# Patient Record
Sex: Female | Born: 1955 | Race: Black or African American | Hispanic: No | Smoking: Former smoker
Health system: Southern US, Community
[De-identification: ages and names within clinical notes are randomized; demographics above are authoritative.]

## PROBLEM LIST (undated history)

## (undated) ENCOUNTER — Emergency Department (HOSPITAL_COMMUNITY): Admission: EM | Payer: Medicare HMO | Source: Home / Self Care

## (undated) DIAGNOSIS — F329 Major depressive disorder, single episode, unspecified: Secondary | ICD-10-CM

## (undated) DIAGNOSIS — F32A Depression, unspecified: Secondary | ICD-10-CM

## (undated) DIAGNOSIS — J302 Other seasonal allergic rhinitis: Secondary | ICD-10-CM

## (undated) DIAGNOSIS — Z720 Tobacco use: Secondary | ICD-10-CM

## (undated) DIAGNOSIS — E785 Hyperlipidemia, unspecified: Secondary | ICD-10-CM

## (undated) DIAGNOSIS — I10 Essential (primary) hypertension: Secondary | ICD-10-CM

## (undated) HISTORY — DX: Major depressive disorder, single episode, unspecified: F32.9

## (undated) HISTORY — DX: Tobacco use: Z72.0

## (undated) HISTORY — DX: Depression, unspecified: F32.A

## (undated) HISTORY — DX: Hyperlipidemia, unspecified: E78.5

---

## 1999-01-18 ENCOUNTER — Emergency Department (HOSPITAL_COMMUNITY): Admission: EM | Admit: 1999-01-18 | Discharge: 1999-01-18 | Payer: Self-pay | Admitting: Emergency Medicine

## 2003-07-31 ENCOUNTER — Emergency Department (HOSPITAL_COMMUNITY): Admission: EM | Admit: 2003-07-31 | Discharge: 2003-07-31 | Payer: Self-pay | Admitting: *Deleted

## 2003-07-31 ENCOUNTER — Encounter: Payer: Self-pay | Admitting: Family Medicine

## 2004-06-27 ENCOUNTER — Emergency Department (HOSPITAL_COMMUNITY): Admission: EM | Admit: 2004-06-27 | Discharge: 2004-06-27 | Payer: Self-pay | Admitting: Emergency Medicine

## 2006-01-01 ENCOUNTER — Ambulatory Visit: Payer: Self-pay | Admitting: *Deleted

## 2006-01-01 ENCOUNTER — Ambulatory Visit: Payer: Self-pay | Admitting: Nurse Practitioner

## 2006-01-17 ENCOUNTER — Ambulatory Visit: Payer: Self-pay | Admitting: Nurse Practitioner

## 2006-01-23 ENCOUNTER — Ambulatory Visit: Payer: Self-pay | Admitting: Nurse Practitioner

## 2006-01-31 ENCOUNTER — Ambulatory Visit (HOSPITAL_COMMUNITY): Admission: RE | Admit: 2006-01-31 | Discharge: 2006-01-31 | Payer: Self-pay | Admitting: Family Medicine

## 2006-02-14 ENCOUNTER — Ambulatory Visit: Payer: Self-pay | Admitting: Nurse Practitioner

## 2006-04-04 ENCOUNTER — Ambulatory Visit: Payer: Self-pay | Admitting: Nurse Practitioner

## 2006-04-04 ENCOUNTER — Ambulatory Visit: Payer: Self-pay | Admitting: *Deleted

## 2010-10-05 ENCOUNTER — Emergency Department (HOSPITAL_COMMUNITY)
Admission: EM | Admit: 2010-10-05 | Discharge: 2010-10-05 | Payer: Self-pay | Source: Home / Self Care | Admitting: Emergency Medicine

## 2010-10-11 ENCOUNTER — Emergency Department (HOSPITAL_COMMUNITY)
Admission: EM | Admit: 2010-10-11 | Discharge: 2010-10-11 | Payer: Self-pay | Source: Home / Self Care | Admitting: Emergency Medicine

## 2010-11-10 ENCOUNTER — Emergency Department (HOSPITAL_COMMUNITY)
Admission: EM | Admit: 2010-11-10 | Discharge: 2010-11-10 | Payer: Self-pay | Source: Home / Self Care | Admitting: Emergency Medicine

## 2010-11-13 LAB — COMPREHENSIVE METABOLIC PANEL
ALT: 26 U/L (ref 0–35)
AST: 30 U/L (ref 0–37)
Albumin: 3.7 g/dL (ref 3.5–5.2)
Alkaline Phosphatase: 88 U/L (ref 39–117)
CO2: 24 mEq/L (ref 19–32)
Chloride: 104 mEq/L (ref 96–112)
Creatinine, Ser: 0.83 mg/dL (ref 0.4–1.2)
GFR calc Af Amer: >60 mL/min (ref >60)
GFR calc non Af Amer: >60 mL/min (ref >60)
Potassium: 4 mEq/L (ref 3.5–5.1)
Total Bilirubin: 0.5 mg/dL (ref 0.3–1.2)

## 2010-11-13 LAB — URINALYSIS, ROUTINE W REFLEX MICROSCOPIC
Bilirubin Urine: NEGATIVE
Ketones, ur: NEGATIVE mg/dL
Protein, ur: NEGATIVE mg/dL
Urine Glucose, Fasting: NEGATIVE mg/dL

## 2010-11-13 LAB — RAPID URINE DRUG SCREEN, HOSP PERFORMED
Amphetamines: NOT DETECTED
Opiates: NOT DETECTED
Tetrahydrocannabinol: NOT DETECTED

## 2010-11-13 LAB — DIFFERENTIAL
Basophils Absolute: 0 10*3/uL (ref 0.0–0.1)
Basophils Relative: 0 % (ref 0–1)
Eosinophils Absolute: 0 10*3/uL (ref 0.0–0.7)
Eosinophils Relative: 0 % (ref 0–5)
Lymphocytes Relative: 17 % (ref 12–46)
Lymphs Abs: 1.3 10*3/uL (ref 0.7–4.0)
Monocytes Absolute: 0.4 10*3/uL (ref 0.1–1.0)
Monocytes Relative: 6 % (ref 3–12)
Neutro Abs: 5.5 10*3/uL (ref 1.7–7.7)
Neutrophils Relative %: 76 % (ref 43–77)

## 2010-11-13 LAB — POCT CARDIAC MARKERS
CKMB, poc: <1 ng/mL — ABNORMAL LOW (ref 1.0–8.0)
Myoglobin, poc: 57.8 ng/mL (ref 12–200)
Myoglobin, poc: 46.7 ng/mL (ref 12–200)
Troponin i, poc: <0.05 ng/mL (ref 0.00–0.09)
Troponin i, poc: <0.05 ng/mL (ref 0.00–0.09)

## 2010-11-13 LAB — POCT PREGNANCY, URINE: Preg Test, Ur: NEGATIVE

## 2010-11-13 LAB — CBC
HCT: 40.9 % (ref 36.0–46.0)
Hemoglobin: 13.8 g/dL (ref 12.0–15.0)
MCH: 30.6 pg (ref 26.0–34.0)
MCHC: 33.7 g/dL (ref 30.0–36.0)
MCV: 90.7 fL (ref 78.0–100.0)
Platelets: 278 10*3/uL (ref 150–400)
RBC: 4.51 MIL/uL (ref 3.87–5.11)
RDW: 14.4 % (ref 11.5–15.5)
WBC: 7.2 10*3/uL (ref 4.0–10.5)

## 2012-07-20 ENCOUNTER — Emergency Department (HOSPITAL_COMMUNITY): Payer: Self-pay

## 2012-07-20 ENCOUNTER — Encounter (HOSPITAL_COMMUNITY): Payer: Self-pay | Admitting: *Deleted

## 2012-07-20 ENCOUNTER — Emergency Department (HOSPITAL_COMMUNITY)
Admission: EM | Admit: 2012-07-20 | Discharge: 2012-07-20 | Disposition: A | Discharge status: Home / Self Care | Payer: Self-pay | Attending: Emergency Medicine | Admitting: Emergency Medicine

## 2012-07-20 DIAGNOSIS — S8390XA Sprain of unspecified site of unspecified knee, initial encounter: Secondary | ICD-10-CM

## 2012-07-20 DIAGNOSIS — Y998 Other external cause status: Secondary | ICD-10-CM | POA: Insufficient documentation

## 2012-07-20 DIAGNOSIS — Y9301 Activity, walking, marching and hiking: Secondary | ICD-10-CM | POA: Insufficient documentation

## 2012-07-20 DIAGNOSIS — S8990XA Unspecified injury of unspecified lower leg, initial encounter: Secondary | ICD-10-CM | POA: Insufficient documentation

## 2012-07-20 DIAGNOSIS — S8000XA Contusion of unspecified knee, initial encounter: Secondary | ICD-10-CM

## 2012-07-20 DIAGNOSIS — F17200 Nicotine dependence, unspecified, uncomplicated: Secondary | ICD-10-CM | POA: Insufficient documentation

## 2012-07-20 DIAGNOSIS — S82009A Unspecified fracture of unspecified patella, initial encounter for closed fracture: Secondary | ICD-10-CM

## 2012-07-20 DIAGNOSIS — W010XXA Fall on same level from slipping, tripping and stumbling without subsequent striking against object, initial encounter: Secondary | ICD-10-CM | POA: Insufficient documentation

## 2012-07-20 MED ORDER — HYDROCODONE-ACETAMINOPHEN 5-500 MG PO TABS: 1.0000 | ORAL_TABLET | Freq: Four times a day (QID) | ORAL | Status: DC | PRN

## 2012-07-20 MED ORDER — HYDROCODONE-ACETAMINOPHEN 5-325 MG PO TABS
2.0000 | ORAL_TABLET | Freq: Once | ORAL | Status: AC
  Administered 2012-07-20: 2 via ORAL
  Filled 2012-07-20: qty 2

## 2012-07-20 NOTE — ED Provider Notes (Signed)
History     CSN: [id number]  Arrival date & time 07/20/12  1308   First MD Initiated Contact with Patient 07/20/12 [phone]      Chief Complaint  Patient presents with  . Knee Pain    (Consider location/radiation/quality/duration/timing/severity/associated sxs/prior treatment) Patient is a 56 y.o. female presenting with knee pain. The history is provided by the patient and the EMS personnel.  Knee Pain Pertinent negatives include no chest pain, no abdominal pain, no headaches and no shortness of breath.  pt states walking home from work last night tripped, falling onto left knee. C/o constant, dull, mod-sev, nonradiating pain to left knee. No prior injury. Is ambulatory. No ankle or hip pain. No numbness/weakness. Small, superficial abrasion to left elbow. No elbow pain or pain w rom. Tetanus within past 2 yrs per patient. Denies head injury or loc. No headache. No neck or back pain. Denies other injury. Denies any faintness or dizziness.      History reviewed. No pertinent past medical history.  History reviewed. No pertinent past surgical history.  No family history on file.  History  Substance Use Topics  . Smoking status: Current Every Day Smoker -- 0.5 packs/day for 5 years    Types: Cigarettes  . Smokeless tobacco: Not on file  . Alcohol Use: Yes    OB History    Grav Para Term Preterm Abortions TAB SAB Ect Mult Living                  Review of Systems  Constitutional: Negative for fever.  HENT: Negative for neck pain and neck stiffness.   Respiratory: Negative for shortness of breath.   Cardiovascular: Negative for chest pain.  Gastrointestinal: Negative for nausea, vomiting and abdominal pain.  Musculoskeletal: Negative for back pain.  Neurological: Negative for headaches.    Allergies  Review of patient's allergies indicates no known allergies.  Home Medications  No current outpatient prescriptions on file.  BP 124/80  Pulse 96  Temp 99.3 F (37.4  C) (Oral)  Resp 16  SpO2 97%  Physical Exam  Nursing note and vitals reviewed. Constitutional: She is oriented to person, place, and time. She appears well-developed and well-nourished. No distress.  HENT:  Head: Atraumatic.  Eyes: Conjunctivae normal are normal. Pupils are equal, round, and reactive to light. No scleral icterus.  Neck: Normal range of motion. Neck supple. No tracheal deviation present.  Cardiovascular: Normal rate and intact distal pulses.   Pulmonary/Chest: Effort normal and breath sounds normal. No respiratory distress. She exhibits no tenderness.  Abdominal: Soft. Normal appearance and bowel sounds are normal. She exhibits no distension. There is no tenderness.  Musculoskeletal:       Mild sts and tenderness left knee anteriorly. Knee stable, no gross ligament laxity appreciated. Limited rom due to pain. Good rom at hip and ankle without pain. No other focal bony tenderness noted on bil ext exam. Distal pulses palp bil ext. Pt w superficial abrasion left elbow, no bony tenderness. CTLS spine, non tender, aligned, no step off.   Neurological: She is alert and oriented to person, place, and time.       Motor intact bil.   Skin: Skin is warm and dry. No rash noted.  Psychiatric: She has a normal mood and affect.    ED Course  Procedures (including critical care time)  Dg Knee Complete 4 Views Left  07/20/2012  *RADIOLOGY REPORT*  Clinical Data:  Knee pain, swelling  LEFT KNEE - COMPLETE  4+ VIEW  Comparison: None.  Findings: Subtle lucency through the superior pole the patella seen best on the lateral view concerning for nondisplaced fracture. There is a moderate knee joint effusion, presumably hemarthrosis. No additional acute fracture, or malalignment identified.  IMPRESSION:  1.  Irregular lucency through the superior pole of the patella concerning for a nondisplaced fracture. A bipartite patella could have a similar appearance.  Recommend clinical correlation for  point tenderness at the superior patella. CT scan could further evaluate if clinically warranted.  2.  Moderate suprapatellar knee joint effusion, presumed hemarthrosis in the setting of trauma.   Original Report Authenticated By: Vilma Prader       MDM  Xrays. Icepack. vicodin po (no meds pta, confirmed nkda w pt).  Reviewed nursing notes and prior charts for additional history.    Knee immobilizer and crutches. Ice pack.  Discussed w ortho on call, Dr Luiz Blare - he reviewed films, states knee imm,crutches and f/u office later this week.   Discussed plan w pt, pt agreeable. Pain controlled.        Suzi Roots, MD 07/20/12 1018

## 2012-07-20 NOTE — ED Notes (Signed)
JXB:[id number]<GN> Expected date:07/20/12<BR> Expected time: 7:39 AM<BR> Means of arrival:Ambulance<BR> Comments:<BR> Knee swelling

## 2012-07-20 NOTE — ED Notes (Signed)
per EMS: pt reports unwittnessed fall yesterday while walking home from work. Pt "tripped and fell". Reports left knee swelling since this morning, no obvious deformities, tender on palpation. Abrasion to left elbow present. Pt not able to bare weight on left side. Strong pedal pulses bilat. bp 140/88, pulse 88, respirations 18 even/non-labored

## 2013-11-30 ENCOUNTER — Encounter (HOSPITAL_COMMUNITY): Payer: Self-pay | Admitting: Emergency Medicine

## 2013-11-30 ENCOUNTER — Emergency Department (HOSPITAL_COMMUNITY): Payer: Worker's Compensation

## 2013-11-30 ENCOUNTER — Emergency Department (HOSPITAL_COMMUNITY)
Admission: EM | Admit: 2013-11-30 | Discharge: 2013-11-30 | Disposition: A | Discharge status: Home / Self Care | Payer: Worker's Compensation | Attending: Emergency Medicine | Admitting: Emergency Medicine

## 2013-11-30 DIAGNOSIS — Y990 Civilian activity done for income or pay: Secondary | ICD-10-CM | POA: Insufficient documentation

## 2013-11-30 DIAGNOSIS — S46909A Unspecified injury of unspecified muscle, fascia and tendon at shoulder and upper arm level, unspecified arm, initial encounter: Secondary | ICD-10-CM | POA: Insufficient documentation

## 2013-11-30 DIAGNOSIS — Y9389 Activity, other specified: Secondary | ICD-10-CM | POA: Insufficient documentation

## 2013-11-30 DIAGNOSIS — S4980XA Other specified injuries of shoulder and upper arm, unspecified arm, initial encounter: Secondary | ICD-10-CM | POA: Insufficient documentation

## 2013-11-30 DIAGNOSIS — M51379 Other intervertebral disc degeneration, lumbosacral region without mention of lumbar back pain or lower extremity pain: Secondary | ICD-10-CM | POA: Insufficient documentation

## 2013-11-30 DIAGNOSIS — M542 Cervicalgia: Secondary | ICD-10-CM

## 2013-11-30 DIAGNOSIS — F17200 Nicotine dependence, unspecified, uncomplicated: Secondary | ICD-10-CM | POA: Insufficient documentation

## 2013-11-30 DIAGNOSIS — M5136 Other intervertebral disc degeneration, lumbar region: Secondary | ICD-10-CM

## 2013-11-30 DIAGNOSIS — S0993XA Unspecified injury of face, initial encounter: Secondary | ICD-10-CM | POA: Insufficient documentation

## 2013-11-30 DIAGNOSIS — M5020 Other cervical disc displacement, unspecified cervical region: Secondary | ICD-10-CM | POA: Insufficient documentation

## 2013-11-30 DIAGNOSIS — S199XXA Unspecified injury of neck, initial encounter: Principal | ICD-10-CM

## 2013-11-30 DIAGNOSIS — Z79899 Other long term (current) drug therapy: Secondary | ICD-10-CM | POA: Insufficient documentation

## 2013-11-30 DIAGNOSIS — W19XXXA Unspecified fall, initial encounter: Secondary | ICD-10-CM

## 2013-11-30 DIAGNOSIS — M5137 Other intervertebral disc degeneration, lumbosacral region: Secondary | ICD-10-CM | POA: Insufficient documentation

## 2013-11-30 DIAGNOSIS — IMO0002 Reserved for concepts with insufficient information to code with codable children: Secondary | ICD-10-CM | POA: Insufficient documentation

## 2013-11-30 DIAGNOSIS — M5030 Other cervical disc degeneration, unspecified cervical region: Secondary | ICD-10-CM

## 2013-11-30 DIAGNOSIS — M549 Dorsalgia, unspecified: Secondary | ICD-10-CM

## 2013-11-30 DIAGNOSIS — R296 Repeated falls: Secondary | ICD-10-CM | POA: Insufficient documentation

## 2013-11-30 DIAGNOSIS — Y9289 Other specified places as the place of occurrence of the external cause: Secondary | ICD-10-CM | POA: Insufficient documentation

## 2013-11-30 MED ORDER — IBUPROFEN 600 MG PO TABS: 600.0000 mg | ORAL_TABLET | Freq: Four times a day (QID) | ORAL | Status: DC | PRN

## 2013-11-30 MED ORDER — CYCLOBENZAPRINE HCL 10 MG PO TABS: 10.0000 mg | ORAL_TABLET | Freq: Two times a day (BID) | ORAL | Status: DC | PRN

## 2013-11-30 MED ORDER — OXYCODONE-ACETAMINOPHEN 5-325 MG PO TABS
2.0000 | ORAL_TABLET | Freq: Once | ORAL | Status: AC
  Administered 2013-11-30: 2 via ORAL
  Filled 2013-11-30: qty 2

## 2013-11-30 NOTE — ED Notes (Signed)
Pt in XRAY 

## 2013-11-30 NOTE — Discharge Instructions (Signed)
Emergency Department Resource Guide °1) Find a Doctor and Pay Out of Pocket °Although you won't have to find out who is covered by your insurance plan, it is a good idea to ask around and get recommendations. You will then need to call the office and see if the doctor you have chosen will accept you as a new patient and what types of options they offer for patients who are self-pay. Some doctors offer discounts or will set up payment plans for their patients who do not have insurance, but you will need to ask so you aren't surprised when you get to your appointment. ° °2) Contact Your Local Health Department °Not all health departments have doctors that can see patients for sick visits, but many do, so it is worth a call to see if yours does. If you don't know where your local health department is, you can check in your phone book. The CDC also has a tool to help you locate your state's health department, and many state websites also have listings of all of their local health departments. ° °3) Find a Walk-in Clinic °If your illness is not likely to be very severe or complicated, you may want to try a walk in clinic. These are popping up all over the country in pharmacies, drugstores, and shopping centers. They're usually staffed by nurse practitioners or physician assistants that have been trained to treat common illnesses and complaints. They're usually fairly quick and inexpensive. However, if you have serious medical issues or chronic medical problems, these are probably not your best option. ° °No Primary Care Doctor: °- Call [facility] at  832-8000 - they can help you locate a primary care doctor that  accepts your insurance, provides certain services, etc. °- Physician Referral Service- 1-800-533-3463 ° °Chronic Pain Problems: °Organization         Address  Phone   Notes  °[facility]  ([phone]) [phone] Patients need to be referred by their primary care doctor.  ° °Medication  Assistance: °Organization         Address  Phone   Notes  °[place] [facility] [address] °[place], [place] [address] ([phone]) 641-8030 --Must be a resident of [place] °-- Must have NO insurance coverage whatsoever (no Medicaid/ Medicare, etc.) °-- The pt. MUST have a primary care doctor that directs their care regularly and follows them in the community °  °[facility]  (866) 331-1348   °[employer]  (888) 892-1162   ° °Agencies that provide inexpensive medical care: °Organization         Address                                                       Phone                                                                            Notes  °[facility]  ([phone]) [phone]   °[facility]    (336)   832-7272   °[facility] [address] °[place], [place] [address] (336) 832-4777   °[facility] [address], °[place] (336) [phone]   °[facility]    (336) 373-0678   °[facility]    (336) 272-1050   °[facility] ° [address], [place] Phone:  (336) 832-4444, Fax:  (336) 832-4440 Hours of Operation:  9 am - 6 pm, M-F.  Also accepts Medicaid/Medicare and self-pay.  °[facility] ° [address], [place] Phone: (336) [phone], Fax: (336) [phone]. Hours of Operation:  8:30 am - 5:30 pm, M-F.  Also accepts Medicaid and self-pay.  °[facility] [place] [address], [place] Phone: (336) [phone]   °[facility] [address], [place], [place] (336)723-1848, Ext. 123 Mondays & Thursdays: 7-9 AM.  First 15 patients are seen on a first come, first serve basis. °  ° °Medicaid-accepting [place] Providers: ° °Organization         Address                                                                       Phone                               Notes  °[facility] [address], [place] (336) [phone] Also accepts self-pay patients.  °[facility] [address], [place] ° (336) [phone]   °[facility] [address], [place] (336) [phone]   °[facility] [address] [place] [address], [place] (336) [phone]   °[facility] [address], [place]  ° (336) [phone] Only accepts [place] Access Medicaid patients after they have their name applied to their card.  ° °Self-Pay (no insurance) in [place]: °  °Organization         Address                                                     Phone               Notes  °Sickle Cell Patients, [facility] [address], [place] (336) [phone]   °[facility] [address], [place] (336) [phone]   °[facility] ° [address], [place] (336) [phone]   °[facility]/Dr. Osei-Bonsu ° [address] [place] [address], [place] or [address], [place] ([phone]) [phone] Phone number for both [place] and [place] locations is the same.  °[facility] [address], [place] (336) [phone]   °[facility] [address] [place] [address], [place] or [address] (336) 852-7530 °(336) 878-2260   °[facility] [address], [place] (336) 350-1642, phone; (336) 294-5005, fax Sees patients 1st and 3rd Saturday of   every month.  Must not qualify for public or private insurance (i.e. Medicaid, Medicare, [place] Health Choice, Veterans' Benefits) • Household income should be no more than 200% of the poverty level •The clinic cannot treat you if you are pregnant or think you are pregnant • Sexually transmitted diseases are not treated at the clinic.  ° °_____________Dental Care:______________ °Organization         Address                                  Phone                       Notes  °[place]  [facility] [address], [place] (336) 641-6152 Accepts children up to age 21 who are enrolled in Medicaid or [place] Health Choice; pregnant women with a Medicaid card; and children who have applied for Medicaid or [place] Health Choice, but were declined, whose parents can pay a reduced fee at time of service.  °[place] [place]  [address], [place] (336) 641-7733 Accepts children up to age 21 who are enrolled in Medicaid or [place] Health Choice; pregnant women with a Medicaid card; and children who have applied for Medicaid or [place] Health Choice, but were declined, whose parents can pay a reduced fee at time of service.  °[facility] ° [address], [place] (336) [phone] Patients are seen by appointment only. Walk-ins are not accepted. [facility] will see patients 18 years of age and older. °Monday - Tuesday (8am-5pm) °Most Wednesdays (8:30-5pm) °$30 per visit, cash only  °[facility] ° [address], [place] (336) [phone] Patients are seen by appointment only. Walk-ins are not accepted. [facility] will see patients 18 years of age and older. °One Wednesday Evening (Monthly: Volunteer Based).  $30 per visit, cash only  °[facility]  (919) 537-3737 for adults; Children under age 4, call Graduate Pediatric Dentistry at (919) [phone]. Children aged 4-14, please call (919) [phone] to request a pediatric application. ° Dental services are provided in all areas of dental care including fillings, crowns and bridges, complete and partial dentures, implants, gum treatment, root canals, and extractions. Preventive care is also provided. Treatment is provided to both adults and children. °Patients are selected via a lottery and there is often a waiting list. °  °[facility] [address], °[place] ° (336) 763-8833  [email or web address].[email or web address] °  °[facility] [address], [place], [place] (336)723-1848, Ext. 123 Second and Fourth Thursday of each month, opens at 6:30 AM; Clinic ends at 9 AM.  Patients are seen on a first-come first-served basis, and a limited number are seen during each clinic.  ° °[facility] ° [address], [place], [place] ([phone]) [phone]   Eligibility Requirements °You must have lived in [place], [place], or [place] counties for at least the last three months. °  You cannot be eligible for state or federal sponsored healthcare insurance, including Veterans Administration, Medicaid, or Medicare. °  You generally cannot be eligible for healthcare insurance through your employer.  °  How to apply: °Eligibility screenings are held every Tuesday and Wednesday afternoon from 1:00 pm until 4:00 pm. You do not need an appointment for the interview!  °  [facility] [address], [place], [place] [phone]   °[facility]  [phone]   °[facility]  [phone]   °[facility]  [phone]   ° °

## 2013-11-30 NOTE — ED Provider Notes (Signed)
CSN: [id number]     Arrival date & time 11/30/13  1610 History   First MD Initiated Contact with Patient 11/30/13 1613     Chief Complaint  Patient presents with  . Fall     (Consider location/radiation/quality/duration/timing/severity/associated sxs/prior Treatment) HPI Pt is a 58yo female with no significant PMH brought in by EMS on LSB and c-collar c/o right shoulder, neck, and back pain after falling at work on ground level, [employer]. Pt was pulling on laundry that had become stuck in an industrial sized dryer when it became loose causing pt to fall onto her back. Pt states she laid on the floor yelling until someone came to help her. Pain is aching and sore, 9/10, worse with movement. Pain is worse in lower back.  No previous hx of back injuries or surgeries. Denies loss of bowel or bladder. No pain medication given PTA.  History reviewed. No pertinent past medical history. History reviewed. No pertinent past surgical history. No family history on file. History  Substance Use Topics  . Smoking status: Current Every Day Smoker -- 0.50 packs/day for 5 years    Types: Cigarettes  . Smokeless tobacco: Not on file  . Alcohol Use: Yes   OB History   Grav Para Term Preterm Abortions TAB SAB Ect Mult Living                 Review of Systems  Cardiovascular: Negative for chest pain.  Gastrointestinal: Negative for abdominal pain.  Musculoskeletal: Positive for back pain, myalgias and neck pain. Negative for neck stiffness.  Skin: Negative for wound.  Neurological: Negative for dizziness, weakness, numbness and headaches.  All other systems reviewed and are negative.      Allergies  Review of patient's allergies indicates no known allergies.  Home Medications   Current Outpatient Rx  Name  Route  Sig  Dispense  Refill  . cholecalciferol (VITAMIN D) 1000 UNITS tablet   Oral   Take 1,000 Units by mouth daily.         . Multiple Vitamin (MULTIVITAMIN WITH MINERALS)  TABS tablet   Oral   Take 1 tablet by mouth daily.         . vitamin E 100 UNIT capsule   Oral   Take 100 Units by mouth daily.         . cyclobenzaprine (FLEXERIL) 10 MG tablet   Oral   Take 1 tablet (10 mg total) by mouth 2 (two) times daily as needed for muscle spasms.   20 tablet   0   . ibuprofen (ADVIL,MOTRIN) 600 MG tablet   Oral   Take 1 tablet (600 mg total) by mouth every 6 (six) hours as needed.   30 tablet   0    BP 124/74  Pulse 61  Temp(Src) 98 F (36.7 C) (Oral)  Resp 16  SpO2 98% Physical Exam  Nursing note and vitals reviewed. Constitutional: She is oriented to person, place, and time. She appears well-developed and well-nourished. No distress.  Pt lying on LSB and in C-collar moving all 4 extremities.   HENT:  Head: Normocephalic and atraumatic.  Eyes: Conjunctivae are normal. No scleral icterus.  Neck: Normal range of motion. Neck supple.  Tenderness along cervical spine w/o step offs or crepitus. Tenderness of right upper trapezius.   Cardiovascular: Normal rate, regular rhythm and normal heart sounds.   Pulmonary/Chest: Effort normal and breath sounds normal. No respiratory distress. She has no wheezes. She has no  rales. She exhibits no tenderness.  Abdominal: Soft. Bowel sounds are normal. She exhibits no distension and no mass. There is no tenderness. There is no rebound and no guarding.  Musculoskeletal: Normal range of motion. She exhibits tenderness. She exhibits no edema.  Tenderness along cervical, thoracic, and lumbar spine.  No step offs or crepitus.  Tenderness along right upper trapezius as well as lumbar paraspinal muscles.  FROM all 4 extremities. 5/5 grip strength. 5/5 plantar flexion and dorsiflexion.   Neurological: She is alert and oriented to person, place, and time. She has normal strength. No cranial nerve deficit or sensory deficit. Coordination normal. GCS eye subscore is 4. GCS verbal subscore is 5. GCS motor subscore is 6.   Antalgic gait  Skin: Skin is warm and dry. She is not diaphoretic.    ED Course  Procedures (including critical care time) Labs Review Labs Reviewed - No data to display Imaging Review Dg Cervical Spine Complete  11/30/2013   CLINICAL DATA:  Status post fall.  Neck pain.  EXAM: CERVICAL SPINE  4+ VIEWS  COMPARISON:  None.  FINDINGS: Vertebral body height and alignment are maintained. There is loss of disc space height and endplate spurring at C5-6 and C6-7. Multilevel facet degenerative disease is also identified. Lung apices are clear.  IMPRESSION: No acute finding.  Degenerative disc disease C5-6 and C6-7.   Electronically Signed   By: Drusilla Kannerhomas  Dalessio M.D.   On: 11/30/2013 17:54   Dg Thoracic Spine 2 View  11/30/2013   CLINICAL DATA:  Back pain after fall.  EXAM: THORACIC SPINE - 2 VIEW  COMPARISON:  None.  FINDINGS: There is no evidence of thoracic spine fracture. Alignment is normal. No other significant bone abnormalities are identified.  IMPRESSION: Normal thoracic spine.   Electronically Signed   By: Roque LiasJames  Green M.D.   On: 11/30/2013 17:51   Dg Lumbar Spine Complete  11/30/2013   CLINICAL DATA:  Lumbar pain after fall.  EXAM: LUMBAR SPINE - COMPLETE 4+ VIEW  COMPARISON:  None.  FINDINGS: There is no evidence of lumbar spine fracture. Alignment is normal. Moderate degenerative disc disease is noted at L5-S1. Atherosclerotic calcifications of abdominal aorta are noted.  IMPRESSION: Moderate degenerative disc disease is noted at L5-S1. No acute abnormality seen in the lumbar spine.   Electronically Signed   By: Roque LiasJames  Green M.D.   On: 11/30/2013 17:54    EKG Interpretation   None       MDM   Final diagnoses:  1. Fall 2. Back pain 3. Neck pain   Pt c/o neck and back pain after falling from ground level onto back today at work.  Denies hitting head or LOC.  Pt is tender along spine and paraspinal muscles. Worse in lumbar spine.  Pt does have cervical spinal tenderness but no step  offs or crepitus. Due to low impact, pt is overall healthy, denies LOC, and no neurological findings, will get plain films of cervical, thoracic, and lumbar spine.  CT not warranted at this time.   Plain films: no acute findings. Significant for DDD. Will discharge home to f/u with her PCP. Rx: flexeril and ibuprofen. Return precautions provided. Pt verbalized understanding and agreement with tx plan.        Junius FinnerErin O'Malley, PA-C 12/01/13 0200

## 2013-11-30 NOTE — ED Notes (Signed)
[facility] Expected [facility]:  Expected time:  Means of arrival:  Comments: HOLD-ems-fall

## 2013-11-30 NOTE — ED Notes (Signed)
Pt from work [facility] [employer] c/o back, neck, and leg pain from a fall at work today [employer] She was pulling laundry out of a industrial sized dryer and fell backward onto back. LSB in placed and C-Collar. No LOC and no use of blood thinners.

## 2013-12-02 NOTE — ED Provider Notes (Signed)
Medical screening examination/treatment/procedure(s) were performed by non-physician practitioner and as supervising physician I was immediately available for consultation/collaboration.  EKG Interpretation   None         Simmone Cape S Sherlene Rickel, MD 12/02/13 1112 

## 2014-12-07 ENCOUNTER — Encounter (HOSPITAL_COMMUNITY): Payer: Self-pay | Admitting: Emergency Medicine

## 2014-12-07 ENCOUNTER — Observation Stay (HOSPITAL_COMMUNITY)
Admission: EM | Admit: 2014-12-07 | Discharge: 2014-12-08 | Disposition: A | Discharge status: Home / Self Care | Payer: No Typology Code available for payment source | Attending: Internal Medicine | Admitting: Internal Medicine

## 2014-12-07 ENCOUNTER — Emergency Department (HOSPITAL_COMMUNITY): Payer: No Typology Code available for payment source

## 2014-12-07 DIAGNOSIS — R079 Chest pain, unspecified: Secondary | ICD-10-CM | POA: Diagnosis present

## 2014-12-07 DIAGNOSIS — R0789 Other chest pain: Principal | ICD-10-CM | POA: Insufficient documentation

## 2014-12-07 DIAGNOSIS — Z79899 Other long term (current) drug therapy: Secondary | ICD-10-CM | POA: Diagnosis not present

## 2014-12-07 DIAGNOSIS — Z87891 Personal history of nicotine dependence: Secondary | ICD-10-CM | POA: Insufficient documentation

## 2014-12-07 DIAGNOSIS — E785 Hyperlipidemia, unspecified: Secondary | ICD-10-CM | POA: Insufficient documentation

## 2014-12-07 HISTORY — DX: Other seasonal allergic rhinitis: J30.2

## 2014-12-07 LAB — COMPREHENSIVE METABOLIC PANEL
ALBUMIN: 3.9 g/dL (ref 3.5–5.2)
ALK PHOS: 101 U/L (ref 39–117)
ALT: 18 U/L (ref 0–35)
ANION GAP: 6 (ref 5–15)
AST: 26 U/L (ref 0–37)
BUN: 14 mg/dL (ref 6–23)
CHLORIDE: 107 mmol/L (ref 96–112)
CO2: 27 mmol/L (ref 19–32)
Calcium: 9.4 mg/dL (ref 8.4–10.5)
Creatinine, Ser: 0.68 mg/dL (ref 0.50–1.10)
GFR calc Af Amer: >90 mL/min (ref >90)
GLUCOSE: 97 mg/dL (ref 70–99)
POTASSIUM: 3.5 mmol/L (ref 3.5–5.1)
SODIUM: 140 mmol/L (ref 135–145)
Total Bilirubin: 0.5 mg/dL (ref 0.3–1.2)
Total Protein: 7.8 g/dL (ref 6.0–8.3)

## 2014-12-07 LAB — CBC
HEMATOCRIT: 41.4 % (ref 36.0–46.0)
Hemoglobin: 13.7 g/dL (ref 12.0–15.0)
MCH: 30.1 pg (ref 26.0–34.0)
MCHC: 33.1 g/dL (ref 30.0–36.0)
MCV: 91 fL (ref 78.0–100.0)
PLATELETS: 312 10*3/uL (ref 150–400)
RBC: 4.55 MIL/uL (ref 3.87–5.11)
RDW: 14.9 % (ref 11.5–15.5)
WBC: 5.5 10*3/uL (ref 4.0–10.5)

## 2014-12-07 LAB — I-STAT TROPONIN, ED: Troponin i, poc: 0 ng/mL (ref 0.00–0.08)

## 2014-12-07 LAB — TROPONIN I

## 2014-12-07 LAB — TSH: TSH: 0.478 u[IU]/mL (ref 0.350–4.500)

## 2014-12-07 MED ORDER — MORPHINE SULFATE 2 MG/ML IJ SOLN: 2.0000 mg | INTRAMUSCULAR | Status: DC | PRN

## 2014-12-07 MED ORDER — ONDANSETRON HCL 4 MG/2ML IJ SOLN: 4.0000 mg | Freq: Three times a day (TID) | INTRAMUSCULAR | Status: DC | PRN

## 2014-12-07 MED ORDER — GI COCKTAIL ~~LOC~~: 30.0000 mL | Freq: Four times a day (QID) | ORAL | Status: DC | PRN

## 2014-12-07 MED ORDER — KETOROLAC TROMETHAMINE 15 MG/ML IJ SOLN: 15.0000 mg | Freq: Once | INTRAMUSCULAR | Status: DC

## 2014-12-07 MED ORDER — SODIUM CHLORIDE 0.9 % IV SOLN: INTRAVENOUS | Status: DC

## 2014-12-07 MED ORDER — ONDANSETRON HCL 4 MG/2ML IJ SOLN: 4.0000 mg | Freq: Four times a day (QID) | INTRAMUSCULAR | Status: DC | PRN

## 2014-12-07 MED ORDER — ACETAMINOPHEN 325 MG PO TABS
650.0000 mg | ORAL_TABLET | ORAL | Status: DC | PRN
  Administered 2014-12-08: 650 mg via ORAL
  Filled 2014-12-07: qty 2

## 2014-12-07 MED ORDER — HEPARIN SODIUM (PORCINE) 5000 UNIT/ML IJ SOLN
5000.0000 [IU] | Freq: Three times a day (TID) | INTRAMUSCULAR | Status: DC
  Administered 2014-12-07 – 2014-12-08 (×3): 5000 [IU] via SUBCUTANEOUS
  Filled 2014-12-07 (×3): qty 1

## 2014-12-07 MED ORDER — ASPIRIN 81 MG PO CHEW
324.0000 mg | CHEWABLE_TABLET | Freq: Once | ORAL | Status: AC
  Administered 2014-12-07: 324 mg via ORAL
  Filled 2014-12-07: qty 4

## 2014-12-07 MED ORDER — NITROGLYCERIN 0.4 MG SL SUBL
0.4000 mg | SUBLINGUAL_TABLET | SUBLINGUAL | Status: DC | PRN
  Administered 2014-12-07 (×2): 0.4 mg via SUBLINGUAL
  Filled 2014-12-07 (×2): qty 1

## 2014-12-07 NOTE — ED Notes (Signed)
Pt states has had central chest pain since Thursday and was told her BP was high that day but does not know what it was. Pt states pain is better today, denies n/v but has diarrhea and SOB.

## 2014-12-07 NOTE — ED Provider Notes (Signed)
CSN: [id number]     Arrival date & time 12/07/14  4098 History   First MD Initiated Contact with Patient 12/07/14 [phone]     Chief Complaint  Patient presents with  . Chest Pain     (Consider location/radiation/quality/duration/timing/severity/associated sxs/prior Treatment) HPI    Bridget Reynolds is a 59 y.o. female c/o central chest pain, radiating to left arm and thoracic back onset 5 days ago, it was 9 out of 10, it lasted for 3 hours, she's had several episodes in between now and then. It started again this morning when she was hanging up clothes x1 hour. States his rated at 4/10 and throbbing. Pain has been constant, non-exertional, non-pleuritic or positional. Pain is associated with palpitations and dry cough. Denies SOB, N/V, diaphoresis, fever,syncope, prior episodes, recent cocaine/methamphetimine use. Denies h/o DVT, PE,  recent travel, leg swelling, hemoptysis.  Pt has not received any ASA or NTG in the last 24 hours.  RF: former smoker, Brother had lethal MI at 60 years,  Last Stress test: ? Cardiologost: ? PCP: ?   History reviewed. No pertinent past medical history. History reviewed. No pertinent past surgical history. History reviewed. No pertinent family history. History  Substance Use Topics  . Smoking status: Former Smoker -- 0.50 packs/day for 5 years    Types: Cigarettes  . Smokeless tobacco: Not on file  . Alcohol Use: Yes   OB History    No data available     Review of Systems    Allergies  Review of patient's allergies indicates no known allergies.  Home Medications   Prior to Admission medications   Medication Sig Start Date End Date Taking? Authorizing Provider  cholecalciferol (VITAMIN D) 1000 UNITS tablet Take 1,000 Units by mouth daily.    Historical Provider, MD  cyclobenzaprine (FLEXERIL) 10 MG tablet Take 1 tablet (10 mg total) by mouth 2 (two) times daily as needed for muscle spasms. 11/30/13   Junius Finner, PA-C  ibuprofen  (ADVIL,MOTRIN) 600 MG tablet Take 1 tablet (600 mg total) by mouth every 6 (six) hours as needed. 11/30/13   Junius Finner, PA-C  Multiple Vitamin (MULTIVITAMIN WITH MINERALS) TABS tablet Take 1 tablet by mouth daily.    Historical Provider, MD  vitamin E 100 UNIT capsule Take 100 Units by mouth daily.    Historical Provider, MD   BP 144/79 mmHg  Pulse 90  Temp(Src) 98.6 F (37 C) (Oral)  Resp 20  Wt 115 lb (52.164 kg)  SpO2 99% Physical Exam  Constitutional: She is oriented to person, place, and time. She appears well-developed and well-nourished. No distress.  HENT:  Head: Normocephalic.  Mouth/Throat: Oropharynx is clear and moist.  Eyes: Conjunctivae and EOM are normal. Pupils are equal, round, and reactive to light.  Neck: Normal range of motion. Neck supple.  Cardiovascular: Normal rate, regular rhythm and intact distal pulses.   Pulmonary/Chest: Effort normal and breath sounds normal. No stridor. No respiratory distress. She has no wheezes. She has no rales. She exhibits no tenderness.  Abdominal: Soft. Bowel sounds are normal. She exhibits no distension and no mass. There is no tenderness. There is no rebound and no guarding.  Musculoskeletal: Normal range of motion. She exhibits no edema or tenderness.  No calf asymmetry, superficial collaterals, palpable cords, edema, Homans sign negative bilaterally.    Neurological: She is alert and oriented to person, place, and time.  Psychiatric: She has a normal mood and affect.  Nursing note and vitals reviewed.   ED  Course  Procedures (including critical care time) Labs Review Labs Reviewed  CBC  COMPREHENSIVE METABOLIC PANEL  Rosezena SensorI-STAT TROPOININ, ED    Imaging Review Dg Chest 2 View  12/07/2014   CLINICAL DATA:  Chest pain for 5 days.  EXAM: CHEST  2 VIEW  COMPARISON:  11/10/2010  FINDINGS: The heart size and pulmonary vascularity are normal. No infiltrates or effusions. Slight chronic accentuation of the interstitial markings.  No acute osseous abnormality. Slight thoracic scoliosis.  IMPRESSION: No acute abnormalities. Chronic accentuation of the interstitial markings.   Electronically Signed   By: Francene BoyersJames  Maxwell M.D.   On: 12/07/2014 09:42     EKG Interpretation   Date/Time:  Tuesday December 07 2014 09:12:11 EST Ventricular Rate:  89 PR Interval:  163 QRS Duration: 72 QT Interval:  348 QTC Calculation: 423 R Axis:   45 Text Interpretation:  Sinus rhythm Left atrial enlargement RSR' in V1 or  V2, probably normal variant Probable left ventricular hypertrophy  Borderline ST elevation, lateral leads No significant change since last  tracing Confirmed by Mirian MoGentry, Matthew [id number]) on 12/07/2014 9:38:15 AM      MDM   Final diagnoses:  Chest pain, moderate coronary artery risk    Filed Vitals:   12/07/14 0913 12/07/14 0914  BP: 144/79 144/79  Pulse: 87 90  Temp: 98.6 F (37 C) 98.6 F (37 C)  TempSrc: Oral Oral  Resp: 16 20  Weight:  115 lb (52.164 kg)  SpO2: 98% 99%    Medications  0.9 %  sodium chloride infusion (not administered)  nitroGLYCERIN (NITROSTAT) SL tablet 0.4 mg (0.4 mg Sublingual Given 12/07/14 0959)  ondansetron (ZOFRAN) injection 4 mg (not administered)  aspirin chewable tablet 324 mg (324 mg Oral Given 12/07/14 0946)    Rayburn MaLoriane Brunker is a pleasant 59 y.o. female presenting with recurrent chest pain slightly suspicious for ACS. She is moderate risk by heart score. EKG is nonischemic, chest x-ray and blood work are unremarkable. Patient reports improvement with sublingual nitroglycerin, pain has decreased to 2 out of 10 however pain persists. Will need admission for cardiac rule out.   Discussed with Dr. Darnelle Catalanama who accepts admission.      Wynetta Emeryicole Ranika Mcniel, PA-C 12/07/14 1105  Mirian MoMatthew Gentry, MD 12/10/14 1622

## 2014-12-07 NOTE — H&P (Signed)
History and Physical:    Bridget Reynolds RUE:[id number] DOB: [birth year] DOA: 12/07/2014  Referring provider: Wynetta Emery, PA-C PCP: No primary care provider on file.   Chief Complaint: Chest pain  History of Present Illness:   Bridget Reynolds is an 58 y.o. female with no prior history of cardiac disease or pulmonary disease who presents with a 5 day history of chest pain that began when she was walking.  The pain is intermittent and the first time it occurred, she laid down and went to sleep, and the pain was gone upon awaking.  It has happened 2 more times since then.  Pain lasts about an hour.  She says that it feels like her heart is racing at these times, and that she feels it beating in her chest.  The only other associated symptom she has noticed is a little bit of nausea.  No associated diaphoresis or dyspnea.  She says she walks a couple of miles a day for exercise.  The patient quit smoking 6 years ago.  Prior 1/2 ppd habit (10 pack years).  Has not had any recent cholesterol checks.    ROS:   Constitutional: No fever, no chills;  Appetite diminished; + weight loss, no weight gain, no fatigue.  HEENT: No blurry vision, no diplopia, no pharyngitis, no dysphagia, +earaches CV: + chest pain, + palpitations, no PND, no orthopnea, no edema.  Resp: No SOB, no cough, no pleuritic pain. GI: + nausea, no vomiting, no diarrhea, no melena, no hematochezia, no constipation, no abdominal pain.  GU: No dysuria, no hematuria, no frequency, no urgency. MSK: no myalgias, no arthralgias.  Neuro:  + headache, no focal neurological deficits, no history of seizures.  Psych: + depression, + anxiety.  Endo: + heat intolerance, no cold intolerance, +polyuria, + polydipsia  Skin: No rashes, no skin lesions.  Heme: No easy bruising.  Travel history: No recent travel.   Past Medical History:   Past Medical History  Diagnosis Date  . Seasonal allergies     Past Surgical History:   History reviewed.  No pertinent past surgical history.  Social History:   History   Social History  . Marital Status: Married    Spouse Name: N/A  . Number of Children: 0  . Years of Education: N/A   Occupational History  . Works in a Armed forces technical officer    Social History Main Topics  . Smoking status: Former Smoker -- 0.50 packs/day for 5 years    Types: Cigarettes  . Smokeless tobacco: Not on file  . Alcohol Use: Yes  . Drug Use: No  . Sexual Activity: No   Other Topics Concern  . Not on file   Social History Narrative   Widowed. Lives alone with 2 cats.    Family history:   Family History  Problem Relation Age of Onset  . Diabetes Mother   . Heart disease Mother   . Heart attack Brother     Died at age 42 of MI  . Emphysema Father     Allergies   Review of patient's allergies indicates no known allergies.  Current Medications:   Prior to Admission medications   Medication Sig Start Date End Date Taking? Authorizing Provider  cyclobenzaprine (FLEXERIL) 10 MG tablet Take 1 tablet (10 mg total) by mouth 2 (two) times daily as needed for muscle spasms. Patient not taking: Reported on 12/07/2014 11/30/13   Junius Finner, PA-C  ibuprofen (ADVIL,MOTRIN) 600 MG tablet Take 1 tablet (  600 mg total) by mouth every 6 (six) hours as needed. Patient not taking: Reported on 12/07/2014 11/30/13   Junius FinnerErin O'Malley, PA-C    Physical Exam:   Filed Vitals:   12/07/14 1030 12/07/14 1100 12/07/14 1216 12/07/14 1539  BP: 105/60 109/64 131/88 142/87  Pulse: 79 73 73 77  Temp:   98.1 F (36.7 C) 98.1 F (36.7 C)  TempSrc:   Oral Oral  Resp: 18 17 18 18   Height:   5' 8.5" (1.74 m)   Weight:      SpO2: 95% 97% 99% 99%     Physical Exam: Blood pressure 142/87, pulse 77, temperature 98.1 F (36.7 C), temperature source Oral, resp. rate 18, height 5' 8.5" (1.74 m), weight 52.164 kg (115 lb), SpO2 99 %. Gen: No acute distress. Head: Normocephalic, atraumatic. Eyes: PERRL, EOMI, sclerae  nonicteric. Mouth: Oropharynx clear. Neck: Supple, no thyromegaly, no lymphadenopathy, no jugular venous distention. Chest: Lungs are clear. CV: Heart sounds are regular. No murmurs, rubs, or gallops. Abdomen: Soft, nontender, nondistended with normal active bowel sounds. Extremities: Extremities are without clubbing, edema, or cyanosis. Skin: Warm and dry. Neuro: Alert and oriented times 3; cranial nerves II through XII grossly intact. Psych: Mood and affect normal.   Data Review:    Labs: Basic Metabolic Panel:  Recent Labs Lab 12/07/14 0920  NA 140  K 3.5  CL 107  CO2 27  GLUCOSE 97  BUN 14  CREATININE 0.68  CALCIUM 9.4   Liver Function Tests:  Recent Labs Lab 12/07/14 0920  AST 26  ALT 18  ALKPHOS 101  BILITOT 0.5  PROT 7.8  ALBUMIN 3.9   CBC:  Recent Labs Lab 12/07/14 0920  WBC 5.5  HGB 13.7  HCT 41.4  MCV 91.0  PLT 312    Radiographic Studies: Dg Chest 2 View  12/07/2014   CLINICAL DATA:  Chest pain for 5 days.  EXAM: CHEST  2 VIEW  COMPARISON:  11/10/2010  FINDINGS: The heart size and pulmonary vascularity are normal. No infiltrates or effusions. Slight chronic accentuation of the interstitial markings. No acute osseous abnormality. Slight thoracic scoliosis.  IMPRESSION: No acute abnormalities. Chronic accentuation of the interstitial markings.   Electronically Signed   By: Francene BoyersJames  Maxwell M.D.   On: 12/07/2014 09:42    EKG: Independently reviewed. Sinus rhythm at 89 bpm.  Left atrial enlargement. RSR' in V1 or V2, probably normal variant. Probable left ventricular hypertrophy. Borderline ST elevation, lateral leads. No significant change since last tracing.   Assessment/Plan:   Active Problems:   Chest pain  Heart score of 3, but pain is atypical and she walks 2 miles daily without difficulty, so I suspect a noncardiac cause of pain.  We'll admit for observation and cycle cardiac markers Q6 hours 3.  There are some abnormalities in her  EKG, but these are unchanged. We'll get a 2-D echocardiogram.  Check a fasting lipid panel in the morning for further risk stratification.  DVT prophylaxis  Subcutaneous heparin ordered.  Code Status: Full. Family Communication: Frances Nickelsatricia Davis (sister): [phone] Disposition Plan: Home when stable.  Time spent: 55 minutes.  Markiesha Delia [facility] Pager [phone] Cell: [phone]   If 7PM-7AM, please contact night-coverage [email or web address] Password [email or web address] 12/07/2014, 6:02 PM

## 2014-12-08 DIAGNOSIS — I319 Disease of pericardium, unspecified: Secondary | ICD-10-CM

## 2014-12-08 DIAGNOSIS — R079 Chest pain, unspecified: Secondary | ICD-10-CM

## 2014-12-08 DIAGNOSIS — E785 Hyperlipidemia, unspecified: Secondary | ICD-10-CM

## 2014-12-08 LAB — LIPID PANEL
CHOLESTEROL: 224 mg/dL — AB (ref 0–200)
HDL: 82 mg/dL (ref >39)
LDL Cholesterol: 121 mg/dL — ABNORMAL HIGH (ref 0–99)
TRIGLYCERIDES: 103 mg/dL (ref <150)
Total CHOL/HDL Ratio: 2.7 RATIO
VLDL: 21 mg/dL (ref 0–40)

## 2014-12-08 LAB — TROPONIN I: Troponin I: <0.03 ng/mL (ref <0.031)

## 2014-12-08 MED ORDER — ASPIRIN EC 81 MG PO TBEC: 81.0000 mg | DELAYED_RELEASE_TABLET | Freq: Every day | ORAL | Status: DC

## 2014-12-08 MED ORDER — ATORVASTATIN CALCIUM 20 MG PO TABS
20.0000 mg | ORAL_TABLET | Freq: Every day | ORAL | Status: DC
Start: 2014-12-08 — End: 2017-12-23

## 2014-12-08 NOTE — Progress Notes (Signed)
  Echocardiogram 2D Echocardiogram has been performed.  Bridget Reynolds 12/08/2014, 2:05 PM

## 2014-12-08 NOTE — Discharge Summary (Signed)
Physician Discharge Summary  Bridget Reynolds [id number] DOB: 03/24/1956 DOA: 12/07/2014  Admit date: 12/07/2014 Discharge date: 12/08/2014  Time spent: 45 minutes  Recommendations for Outpatient Follow-up:  1. Follow up with PCP in 2-4 weeks   Discharge Diagnoses:  Active Problems:   Chest pain   Chest pain, moderate coronary artery risk   Hyperlipidemia  Discharge Condition: stable  Diet recommendation: heart healthy  Filed Weights   12/07/14 0914  Weight: 52.164 kg (115 lb)   History of present illness:  Bridget Reynolds is an 59 y.o. female with no prior history of cardiac disease or pulmonary disease who presents with a 5 day history of chest pain that began when she was walking.The pain is intermittent and the first time it occurred, she laid down and went to sleep, and the pain was gone upon awaking.It has happened 2 more times since then. Pain lasts about an hour.She says that it feels like her heart is racing at these times, and that she feels it beating in her chest.The only other associated symptom she has noticed is a little bit of nausea.No associated diaphoresis or dyspnea. She says she walks a couple of miles a day for exercise.The patient quit smoking 6 years ago.Prior 1/2 ppd habit (10 pack years).Has not had any recent cholesterol checks.   Hospital Course:  Patient was admitted with atypical chest pain which resolved prior to admission. She has a good exercise capacity with walking 2 miles every day without chest pain, dyspnea or other complaints. Her troponin was checked x 3 and was negative, she underwent a 2D echo which showed normal EF, grade 1 diastolic dysfunction. She was started on 81 aspirin. Lipid panel showed hyperlipidemia and she was started on Lipitor. She was discharged home in stable condition, she is in the process of establishing with a PCP and will follow up in 2-4 weeks.   Procedures:  2D echo  Study Conclusions - Left ventricle:  The cavity size was normal. Wall thickness was normal. Systolic function was normal. The estimated ejection fraction was in the range of 60% to 65%. Wall motion was normal;there were no regional wall motion abnormalities. Doppler parameters are consistent with abnormal left ventricular relaxation (grade 1 diastolic dysfunction). Pulmonary arteries: Systolic pressure was mildly increased. PA peak pressure: 32 mm Hg (S).   Consultations:  None   Discharge Exam: Filed Vitals:   12/07/14 1539 12/07/14 2211 12/08/14 0542 12/08/14 1335  BP: 142/87 153/97 127/80 135/80  Pulse: 77 67 71 65  Temp: 98.1 F (36.7 C) 97.9 F (36.6 C) 97.5 F (36.4 C) 97.8 F (36.6 C)  TempSrc: Oral Oral Oral Oral  Resp: 18 18 20 18   Height:      Weight:      SpO2: 99% 100% 99% 100%   General: NAD Cardiovascular: RRR Respiratory: CTA biL  Discharge Instructions    Medication List    TAKE these medications        aspirin EC 81 MG tablet  Take 1 tablet (81 mg total) by mouth daily.     atorvastatin 20 MG tablet  Commonly known as:  LIPITOR  Take 1 tablet (20 mg total) by mouth daily.     cyclobenzaprine 10 MG tablet  Commonly known as:  FLEXERIL  Take 1 tablet (10 mg total) by mouth 2 (two) times daily as needed for muscle spasms.     ibuprofen 600 MG tablet  Commonly known as:  ADVIL,MOTRIN  Take 1 tablet (600 mg  total) by mouth every 6 (six) hours as needed.        The results of significant diagnostics from this hospitalization (including imaging, microbiology, ancillary and laboratory) are listed below for reference.    Significant Diagnostic Studies: Dg Chest 2 View  12/07/2014   CLINICAL DATA:  Chest pain for 5 days.  EXAM: CHEST  2 VIEW  COMPARISON:  11/10/2010  FINDINGS: The heart size and pulmonary vascularity are normal. No infiltrates or effusions. Slight chronic accentuation of the interstitial markings. No acute osseous abnormality. Slight thoracic scoliosis.  IMPRESSION: No  acute abnormalities. Chronic accentuation of the interstitial markings.   Electronically Signed   By: Francene Boyers M.D.   On: 12/07/2014 09:42   Labs: Basic Metabolic Panel:  Recent Labs Lab 12/07/14 0920  NA 140  K 3.5  CL 107  CO2 27  GLUCOSE 97  BUN 14  CREATININE 0.68  CALCIUM 9.4   Liver Function Tests:  Recent Labs Lab 12/07/14 0920  AST 26  ALT 18  ALKPHOS 101  BILITOT 0.5  PROT 7.8  ALBUMIN 3.9   CBC:  Recent Labs Lab 12/07/14 0920  WBC 5.5  HGB 13.7  HCT 41.4  MCV 91.0  PLT 312   Cardiac Enzymes:  Recent Labs Lab 12/07/14 1913 12/08/14 0005 12/08/14 0543  TROPONINI <0.03 <0.03 <0.03   Signed:  Pamella Pert  [facility] 12/08/2014, 5:13 PM

## 2014-12-08 NOTE — Progress Notes (Signed)
UR completed 

## 2015-02-21 ENCOUNTER — Emergency Department (HOSPITAL_COMMUNITY)
Admission: EM | Admit: 2015-02-21 | Discharge: 2015-02-21 | Disposition: A | Discharge status: Home / Self Care | Payer: Worker's Compensation | Attending: Emergency Medicine | Admitting: Emergency Medicine

## 2015-02-21 ENCOUNTER — Encounter (HOSPITAL_COMMUNITY): Payer: Self-pay

## 2015-02-21 DIAGNOSIS — Z7982 Long term (current) use of aspirin: Secondary | ICD-10-CM | POA: Diagnosis not present

## 2015-02-21 DIAGNOSIS — Z8709 Personal history of other diseases of the respiratory system: Secondary | ICD-10-CM | POA: Diagnosis not present

## 2015-02-21 DIAGNOSIS — Z79899 Other long term (current) drug therapy: Secondary | ICD-10-CM | POA: Insufficient documentation

## 2015-02-21 DIAGNOSIS — Z87828 Personal history of other (healed) physical injury and trauma: Secondary | ICD-10-CM | POA: Insufficient documentation

## 2015-02-21 DIAGNOSIS — M545 Low back pain: Secondary | ICD-10-CM | POA: Diagnosis present

## 2015-02-21 DIAGNOSIS — G8929 Other chronic pain: Secondary | ICD-10-CM | POA: Diagnosis not present

## 2015-02-21 DIAGNOSIS — M5442 Lumbago with sciatica, left side: Secondary | ICD-10-CM

## 2015-02-21 DIAGNOSIS — Z87891 Personal history of nicotine dependence: Secondary | ICD-10-CM | POA: Insufficient documentation

## 2015-02-21 LAB — URINALYSIS, ROUTINE W REFLEX MICROSCOPIC
GLUCOSE, UA: NEGATIVE mg/dL
Hgb urine dipstick: NEGATIVE
Ketones, ur: NEGATIVE mg/dL
Nitrite: NEGATIVE
Protein, ur: NEGATIVE mg/dL
Specific Gravity, Urine: 1.027 (ref 1.005–1.030)
Urobilinogen, UA: 1 mg/dL (ref 0.0–1.0)
pH: 5 (ref 5.0–8.0)

## 2015-02-21 LAB — URINE MICROSCOPIC-ADD ON

## 2015-02-21 MED ORDER — IBUPROFEN 800 MG PO TABS: 800.0000 mg | ORAL_TABLET | Freq: Three times a day (TID) | ORAL | Status: DC

## 2015-02-21 MED ORDER — CYCLOBENZAPRINE HCL 10 MG PO TABS: 10.0000 mg | ORAL_TABLET | Freq: Two times a day (BID) | ORAL | Status: DC | PRN

## 2015-02-21 NOTE — ED Notes (Signed)
Pt states injury to back a year ago. Is supposed to be on a light duty but unable to get that at work.  Works in [employer]. Was at work the other day and feels she may have injured her back again. Has had pain over entire lower back.  Denies changes in urination or flank pain.  Pt went to work today and told them she had to come here for evaluation.

## 2015-02-21 NOTE — ED Provider Notes (Signed)
CSN: [id number]     Arrival date & time 02/21/15  1258 History  This chart was scribed for non-physician practitioner, Ladona MowJoe Lynna Zamorano, PA-C,working with Arby BarretteMarcy Pfeiffer, MD, by Karle PlumberJennifer Tensley, ED Scribe. This patient was seen in room WTR6/WTR6 and the patient's care was started at 1:58 PM.  Chief Complaint  Patient presents with  . Back Pain   Patient is a 59 y.o. female presenting with back pain. The history is provided by the patient and medical records. No language interpreter was used.  Back Pain Associated symptoms: no dysuria, no fever, no numbness and no weakness     HPI Comments:  Bridget Reynolds is a 59 y.o. female with PMHx of chronic lower back pain who presents to the Emergency Department complaining of severe lower back pain that began approximately one week ago. She states she the pain is secondary to an injury at work about 11 months ago and flares up intermittently. She has just been resting as treatment of the pain with no significant relief of the pain. Denies alleviating factors. Movements makes the pain worse. She does not have a PCP but has seen an orthopedist through workers compensation at [facility], Dr. Gerilyn Nestleamon. Denies fever, chills, nausea, vomiting, bowel or bladder incontinence, urinary symptoms, numbness, tingling or weakness of the lower extremities, saddle anesthesia, wounds or bruising. Patient denies history of IV drug use or cancer. PMHx of seasonal allergies.  Past Medical History  Diagnosis Date  . Seasonal allergies    History reviewed. No pertinent past surgical history. Family History  Problem Relation Age of Onset  . Diabetes Mother   . Heart disease Mother   . Heart attack Brother     Died at age 659 of MI  . Emphysema Father    History  Substance Use Topics  . Smoking status: Former Smoker -- 0.50 packs/day for 5 years    Types: Cigarettes  . Smokeless tobacco: Not on file  . Alcohol Use: Yes   OB History    No data available      Review of Systems  Constitutional: Negative for fever and chills.  Gastrointestinal: Negative for nausea and vomiting.  Genitourinary: Negative for dysuria, urgency, frequency, hematuria, flank pain, decreased urine volume and difficulty urinating.       No bowel or bladder incontinence.  Musculoskeletal: Positive for back pain.  Skin: Negative for color change and wound.  Neurological: Negative for weakness and numbness.   Allergies  Review of patient's allergies indicates no known allergies.  Home Medications   Prior to Admission medications   Medication Sig Start Date End Date Taking? Authorizing Provider  aspirin EC 81 MG tablet Take 1 tablet (81 mg total) by mouth daily. 12/08/14   Costin Otelia SergeantM Gherghe, MD  atorvastatin (LIPITOR) 20 MG tablet Take 1 tablet (20 mg total) by mouth daily. 12/08/14   Costin Otelia SergeantM Gherghe, MD  cyclobenzaprine (FLEXERIL) 10 MG tablet Take 1 tablet (10 mg total) by mouth 2 (two) times daily as needed for muscle spasms. 02/21/15   Ladona MowJoe Clyda Smyth, PA-C  ibuprofen (ADVIL,MOTRIN) 800 MG tablet Take 1 tablet (800 mg total) by mouth 3 (three) times daily. 02/21/15   Ladona MowJoe Niah Heinle, PA-C   Triage Vitals: BP 160/101 mmHg  Pulse 96  Temp(Src) 98.3 F (36.8 C) (Oral)  Resp 18  SpO2 99% Physical Exam  Constitutional: She is oriented to person, place, and time. She appears well-developed and well-nourished.  HENT:  Head: Normocephalic and atraumatic.  Eyes: EOM are normal.  Neck: Normal range of motion.  Cardiovascular: Normal rate.   Pulmonary/Chest: Effort normal.  Musculoskeletal: Normal range of motion. She exhibits tenderness.  Diffuse lumbar through S-2 tenderness of the spinous processes and paraspinous muscles.  Neurological: She is alert and oriented to person, place, and time. She has normal strength. No cranial nerve deficit or sensory deficit. She displays a negative Romberg sign. Coordination and gait normal. GCS eye subscore is 4. GCS verbal subscore is 5. GCS motor  subscore is 6.  Patient fully alert, answering questions appropriately in full, clear sentences. Cranial nerves II through XII grossly intact. Motor strength 5 out of 5 in all major muscle groups of upper and lower extremities. Distal sensation intact. Steady gait.  Skin: Skin is warm and dry.  Psychiatric: She has a normal mood and affect. Her behavior is normal.  Nursing note and vitals reviewed.   ED Course  Procedures (including critical care time) DIAGNOSTIC STUDIES: Oxygen Saturation is 99% on RA, normal by my interpretation.   COORDINATION OF CARE: 2:05 PM- Advised pt to follow up with orthopedist and will provide resources for pt to establish care with a PCP. Advised her to do ROM exercises as tolerated and apply a heating pad intermittently. Will prescribe Ibuprofen and muscle relaxer. Pt verbalizes understanding and agrees to plan.  Medications - No data to display  Labs Review Labs Reviewed  URINALYSIS, ROUTINE W REFLEX MICROSCOPIC - Abnormal; Notable for the following:    APPearance CLOUDY (*)    Bilirubin Urine SMALL (*)    Leukocytes, UA LARGE (*)    All other components within normal limits  URINE MICROSCOPIC-ADD ON - Abnormal; Notable for the following:    Squamous Epithelial / LPF MANY (*)    Crystals CA OXALATE CRYSTALS (*)    All other components within normal limits    Imaging Review No results found.   EKG Interpretation None      MDM   Final diagnoses:  Midline low back pain with left-sided sciatica    Patient with back pain consistent with her chronic back pain. Patient reports exacerbation of same, physical exam and history unremarkable for new injury.  No neurological deficits and normal neuro exam.  Patient can walk but states is painful.  No loss of bowel or bladder control.  No concern for cauda equina.  No fever, night sweats, weight loss, h/o cancer, IVDU.  RICE protocol and pain medicine indicated and discussed with patient. I encouraged  follow-up with her workman's compensation physician as well as a primary care physician. Discussed return precautions, patient verbalizes understanding and agreement of this plan. Encouraged patient to call or return to ER if any worsening of symptoms or should she have any questions or concerns.  I personally performed the services described in this documentation, which was scribed in my presence. The recorded information has been reviewed and is accurate.  BP 127/80 mmHg  Pulse 91  Temp(Src) 98.3 F (36.8 C) (Oral)  Resp 18  SpO2 97%  Signed,  Ladona Mow, PA-C 2:53 PM   Ladona Mow, PA-C 02/21/15 1453  Ladona Mow, PA-C 02/21/15 1535  Arby Barrette, MD 02/24/15 [phone]

## 2015-02-21 NOTE — Discharge Instructions (Signed)
Back Pain, Adult °Low back pain is very common. About 1 in 5 people have back pain. The cause of low back pain is rarely dangerous. The pain often gets better over time. About half of people with a sudden onset of back pain feel better in just 2 weeks. About 8 in 10 people feel better by 6 weeks.  °CAUSES °Some common causes of back pain include: °· Strain of the muscles or ligaments supporting the spine. °· Wear and tear (degeneration) of the spinal discs. °· Arthritis. °· Direct injury to the back. °DIAGNOSIS °Most of the time, the direct cause of low back pain is not known. However, back pain can be treated effectively even when the exact cause of the pain is unknown. Answering your caregiver's questions about your overall health and symptoms is one of the most accurate ways to make sure the cause of your pain is not dangerous. If your caregiver needs more information, he or she may order lab work or imaging tests (X-rays or MRIs). However, even if imaging tests show changes in your back, this usually does not require surgery. °HOME CARE INSTRUCTIONS °For many people, back pain returns. Since low back pain is rarely dangerous, it is often a condition that people can learn to manage on their own.  °· Remain active. It is stressful on the back to sit or stand in one place. Do not sit, drive, or stand in one place for more than 30 minutes at a time. Take short walks on level surfaces as soon as pain allows. Try to increase the length of time you walk each day. °· Do not stay in bed. Resting more than 1 or 2 days can delay your recovery. °· Do not avoid exercise or work. Your body is made to move. It is not dangerous to be active, even though your back may hurt. Your back will likely heal faster if you return to being active before your pain is gone. °· Pay attention to your body when you  bend and lift. Many people have less discomfort when lifting if they bend their knees, keep the load close to their bodies, and  avoid twisting. Often, the most comfortable positions are those that put less stress on your recovering back. °· Find a comfortable position to sleep. Use a firm mattress and lie on your side with your knees slightly bent. If you lie on your back, put a pillow under your knees. °· Only take over-the-counter or prescription medicines as directed by your caregiver. Over-the-counter medicines to reduce pain and inflammation are often the most helpful. Your caregiver may prescribe muscle relaxant drugs. These medicines help dull your pain so you can more quickly return to your normal activities and healthy exercise. °· Put ice on the injured area. °¨ Put ice in a plastic bag. °¨ Place a towel between your skin and the bag. °¨ Leave the ice on for 15-20 minutes, 03-04 times a day for the first 2 to 3 days. After that, ice and heat may be alternated to reduce pain and spasms. °· Ask your caregiver about trying back exercises and gentle massage. This may be of some benefit. °· Avoid feeling anxious or stressed. Stress increases muscle tension and can worsen back pain. It is important to recognize when you are anxious or stressed and learn ways to manage it. Exercise is a great option. °SEEK MEDICAL CARE IF: °· You have pain that is not relieved with rest or medicine. °· You have pain that does not improve in 1 week. °· You have new symptoms. °· You are generally not feeling well. °SEEK   IMMEDIATE MEDICAL CARE IF:  °· You have pain that radiates from your back into your legs. °· You develop new bowel or bladder control problems. °· You have unusual weakness or numbness in your arms or legs. °· You develop nausea or vomiting. °· You develop abdominal pain. °· You feel faint. °Document Released: 10/08/2005 Document Revised: 04/08/2012 Document Reviewed: 02/09/2014 °ExitCare® Patient Information ©2015 [employer]. This information is not intended to replace advice given to you by your health care provider. Make sure you  discuss any questions you have with your health care provider. ° ° °Back Exercises °Back exercises help treat and prevent back injuries. The goal of back exercises is to increase the strength of your abdominal and back muscles and the flexibility of your back. These exercises should be started when you no longer have back pain. Back exercises include: °· Pelvic Tilt. Lie on your back with your knees bent. Tilt your pelvis until the lower part of your back is against the floor. Hold this position 5 to 10 sec and repeat 5 to 10 times. °· Knee to Chest. Pull first 1 knee up against your chest and hold for 20 to 30 seconds, repeat this with the other knee, and then both knees. This may be done with the other leg straight or bent, whichever feels better. °· Sit-Ups or Curl-Ups. Bend your knees 90 degrees. Start with tilting your pelvis, and do a partial, slow sit-up, lifting your trunk only 30 to 45 degrees off the floor. Take at least 2 to 3 seconds for each sit-up. Do not do sit-ups with your knees out straight. If partial sit-ups are difficult, simply do the above but with only tightening your abdominal muscles and holding it as directed. °· Hip-Lift. Lie on your back with your knees flexed 90 degrees. Push down with your feet and shoulders as you raise your hips a couple inches off the floor; hold for 10 seconds, repeat 5 to 10 times. °· Back arches. Lie on your stomach, propping yourself up on bent elbows. Slowly press on your hands, causing an arch in your low back. Repeat 3 to 5 times. Any initial stiffness and discomfort should lessen with repetition over time. °· Shoulder-Lifts. Lie face down with arms beside your body. Keep hips and torso pressed to floor as you slowly lift your head and shoulders off the floor. °Do not overdo your exercises, especially in the beginning. Exercises may cause you some mild back discomfort which lasts for a few minutes; however, if the pain is more severe, or lasts for more than  15 minutes, do not continue exercises until you see your caregiver. Improvement with exercise therapy for back problems is slow.  °See your caregivers for assistance with developing a proper back exercise program. °Document Released: 11/15/2004 Document Revised: 12/31/2011 Document Reviewed: 08/09/2011 °ExitCare® Patient Information ©2015 [employer]. This information is not intended to replace advice given to you by your health care provider. Make sure you discuss any questions you have with your health care provider. ° ° °Emergency Department Resource Guide °1) Find a Doctor and Pay Out of Pocket °Although you won't have to find out who is covered by your insurance plan, it is a good idea to ask around and get recommendations. You will then need to call the office and see if the doctor you have chosen will accept you as a new patient and what types of options they offer for patients who are self-pay. Some doctors offer discounts or   will set up payment plans for their patients who do not have insurance, but you will need to ask so you aren't surprised when you get to your appointment. ° °2) Contact Your Local Health Department °Not all health departments have doctors that can see patients for sick visits, but many do, so it is worth a call to see if yours does. If you don't know where your local health department is, you can check in your phone book. The CDC also has a tool to help you locate your state's health department, and many state websites also have listings of all of their local health departments. ° °3) Find a Walk-in Clinic °If your illness is not likely to be very severe or complicated, you may want to try a walk in clinic. These are popping up all over the country in pharmacies, drugstores, and shopping centers. They're usually staffed by nurse practitioners or physician assistants that have been trained to treat common illnesses and complaints. They're usually fairly quick and inexpensive. However,  if you have serious medical issues or chronic medical problems, these are probably not your best option. ° °No Primary Care Doctor: °- Call [facility] at  832-8000 - they can help you locate a primary care doctor that  accepts your insurance, provides certain services, etc. °- Physician Referral Service- 1-800-533-3463 ° °Chronic Pain Problems: °Organization         Address  Phone   Notes  °[facility]  (336) 297-2271 Patients need to be referred by their primary care doctor.  ° °Medication Assistance: °Organization         Address  Phone   Notes  °[place] [facility] [address] °[place], [place] [address] (336) 641-8030 --Must be a resident of [place] °-- Must have NO insurance coverage whatsoever (no Medicaid/ Medicare, etc.) °-- The pt. MUST have a primary care doctor that directs their care regularly and follows them in the community °  °[facility]  (866) 331-1348   °[employer]  (888) 892-1162   ° °Agencies that provide inexpensive medical care: °Organization         Address  Phone   Notes  °[facility]  (336) 832-8035   °[facility]    (336) 832-7272   °[facility] [address] °[place], [place] [address] (336) 832-4777   °[facility] [address], °[place] (336) 271-4999   °[facility]    (336) 373-0678   °[facility]    (336) 272-1050   °[facility] ° [address], [place] Phone:  (336) 832-4444, Fax:  (336) 832-4440 Hours of Operation:  9 am - 6 pm, M-F.  Also accepts Medicaid/Medicare and self-pay.  °[facility] ° [address],  Phone: (336) 832-3150, Fax: (336) 832-3151. Hours of Operation:  8:30 am - 5:30 pm, M-F.  Also accepts Medicaid and self-pay.  °[facility] [place] [address], [place] Phone: (336) 878-6027   °[facility] [address], [place],  (336)723-1848, Ext. 123 Mondays & Thursdays: 7-9 AM.  First 15 patients are seen on a first come, first serve basis. °  ° °Medicaid-accepting [place] Providers: ° °Organization         Address  Phone   Notes  °[facility] [address],  (336)   641-2100 Also accepts self-pay patients.  °[facility] [address], [place] ° (336) 856-9996   °[facility] [address], [place] (336) 288-8857   °[facility] [address] [place] [address], [place] (336) 299-7000   °Veita Bland [address], [place]  ° (336) 373-1557 Only accepts [place] Access Medicaid patients after they have their name applied to their card.  ° °Self-Pay (no insurance) in [place]: ° °Organization         Address  Phone   Notes  °Sickle Cell Patients, [facility] [address], [place] (336) 832-1970   °[facility] [address], [place] (336) 832-4400   °[facility] ° [address], [place] (336) 992-4800   °[facility]/Dr. Osei-Bonsu ° [address] [place] [address], [place] or [address], [place] (336) 841-8500 Phone number for both [place] and [place] locations is the same.  °[facility] [address], [place] (336) 299-0000   °[facility] [address] [place] [address], [place] or [address] (336) 852-7530 °(336) 878-2260   °[facility] [address],  (336) 350-1642, phone; (336) 294-5005, fax Sees patients 1st and 3rd Saturday of every month.  Must not qualify for public or private insurance (i.e. Medicaid, Medicare, [place] Health Choice, Veterans' Benefits) • Household income should be no more than 200% of the poverty level •The clinic cannot treat you if you are pregnant or think you are  pregnant • Sexually transmitted diseases are not treated at the clinic.  ° ° °Dental Care: °Organization         Address  Phone  Notes  °[facility] [address],  (336) 641-6152 Accepts children up to age 21 who are enrolled in Medicaid or [place] Health Choice; pregnant women with a Medicaid card; and children who have applied for Medicaid or [place] Health Choice, but were declined, whose parents can pay a reduced fee at time of service.  °[facility] [place]  [address], [place] (336) 641-7733 Accepts children up to age 21 who are enrolled in Medicaid or [place] Health Choice; pregnant women with a Medicaid card; and children who have applied for Medicaid or [place] Health Choice, but were declined, whose parents can pay a reduced fee at time of service.  °[facility] ° [address],  (336) 641-4533 Patients are seen by appointment only. Walk-ins are not accepted. [facility] will see patients 18 years of age and older. °Monday - Tuesday (8am-5pm) °Most Wednesdays (8:30-5pm) °$30 per visit, cash only  °[facility] ° [address], [place] (336) 641-4533 Patients are seen by appointment only. Walk-ins are not accepted. [facility] will see patients 18 years of age and older. °One Wednesday Evening (Monthly: Volunteer Based).  $30 per visit, cash only  °[facility]  (919) [phone] for adults; Children under age 4, call Graduate Pediatric Dentistry at ([phone]) [phone]. Children aged 4-14, please call (919) [phone] to request a pediatric application. ° Dental services are provided in all areas of dental care including fillings, crowns and bridges, complete and partial dentures, implants, gum treatment, root canals, and extractions. Preventive care is also provided. Treatment   is provided to both adults and  children. °Patients are selected via a lottery and there is often a waiting list. °  °[facility] [address], °[place] ° (336) 763-8833 [email or web address] °  °[facility] [address], [place], [place] (336)723-1848, Ext. 123 Second and Fourth Thursday of each month, opens at 6:30 AM; Clinic ends at 9 AM.  Patients are seen on a first-come first-served basis, and a limited number are seen during each clinic.  ° °[facility] ° [address], [place], [place] (336) 723-7904   Eligibility Requirements °You must have lived in [place], [place], or [place] counties for at least the last three months. °  You cannot be eligible for state or federal sponsored healthcare insurance, including Veterans Administration, Medicaid, or Medicare. °  You generally cannot be eligible for healthcare insurance through your employer.  °  How to apply: °Eligibility screenings are held every Tuesday and Wednesday afternoon from 1:00 pm until 4:00 pm. You do not need an appointment for the interview!  °[facility] [address], [place], [place] 336-631-2330   °[facility]  336-342-8273   °[facility]  336-703-3100   °[facility]  336-570-6415   ° °Behavioral Health Resources in the Community: °Intensive Outpatient Programs °Organization         Address  Phone  Notes  °[facility] [address], [place], [place] 336-878-6098   °[facility] [address], [place], [place] 336-832-9800   °[facility] [address], [place], [place] ° 336-882-2125   °[facility] [address],  °[place], [place] 1-800-853-5163 or 336-641-4981   °Substance Abuse Resources °Organization         Address  Phone  Notes  °[facility]  336-882-2125   °[facility]  336-784-9470   °[facility]  336-285-9073    °[facility]  336-845-3988   °Residential & Outpatient Substance Abuse Program  1-800-659-3381   °Psychological Services °Organization         Address  Phone  Notes  °[facility]  336- 832-9600   °[facility]  336- 378-7881   °[facility] [address], [place] 1-800-853-5163 or 336-641-4981   ° °Mobile Crisis Teams °Organization         Address  Phone  Notes  °[facility], Mobile Crisis Care Unit  1-877-626-1772   °Assertive °[facility] ° [address] [place], [place] 336-834-9664   °Sharon DeEsch [address] °[place] 336-554-5454   ° °Self-Help/Support Groups °Organization         Address  Phone             Notes  °[facility] - variety of support groups  336- 373-1402 Call for more information  °Narcotics Anonymous (NA), [facility] [address], °[place]  2 meetings at this location  ° °Residential Treatment Programs °Organization         Address  Phone  Notes  °[facility] [address],    ° [place]  1-866-801-8205   °[facility] ° [address], [place],  704-293-8524   °[facility] [address], [place] 336-845-3988 Admissions: 8am-3pm M-F  °[facility] [address]    °[place],   [place] 336-841-1104   °[facility] [address], [place], [place] 336-379-7146   °[facility] [address]  °[place], [place] 336-285-9073   °[facility] - Intensive Outpatient [address], [place], [place] 336-852-3033   °[facility] [address]  °[place], [place] 1-877-615-2722 or 336-784-9470   °[facility] [address] [place], [place] 336-227-7417 Accepts Medicaid  °[facility] [address]  °[place] 1-800-659-3381 Substance Abuse/Addiction Treatment  ° °[place]  Behavioral Health Resources °Organization         Address  Phone  Notes  °[facility]  (888) 581-9988   °Julie Brannon, PhD [address] [place], [place]   (336) 349-5553 or (336) 951-0000   °[facility]   [address] °[place], [place] (336) 349-4454   °[facility] [address], [place], [place] (336) 342-8316 Insurance/Medicaid/sponsorship through [facility]  °[facility] [address]                                    [place], [place] (336) 342-8316 Therapy/tele-psych/case  °[facility] [address]  ° [place],  (336) 349-2233    °Dr. Arfeen  (336) 349-4544   °[facility] [place]  [employer] [place] [facility] 1) [address], [place] °2) [address], [place] °3)  [address], [place] (336) 349-3220 °(336) 342-7768 ° °(336) 342-8140   °[place] [facility] (336) 342-1394 or (336) 342-3537 (After Hours)    ° ° ° °

## 2015-12-19 ENCOUNTER — Encounter (HOSPITAL_COMMUNITY): Payer: Self-pay

## 2015-12-19 ENCOUNTER — Emergency Department (HOSPITAL_COMMUNITY): Payer: Self-pay

## 2015-12-19 ENCOUNTER — Emergency Department (HOSPITAL_COMMUNITY)
Admission: EM | Admit: 2015-12-19 | Discharge: 2015-12-19 | Disposition: A | Discharge status: Home / Self Care | Payer: Self-pay | Attending: Emergency Medicine | Admitting: Emergency Medicine

## 2015-12-19 DIAGNOSIS — R1011 Right upper quadrant pain: Secondary | ICD-10-CM

## 2015-12-19 DIAGNOSIS — Z79899 Other long term (current) drug therapy: Secondary | ICD-10-CM | POA: Insufficient documentation

## 2015-12-19 DIAGNOSIS — Z87891 Personal history of nicotine dependence: Secondary | ICD-10-CM | POA: Insufficient documentation

## 2015-12-19 DIAGNOSIS — M25511 Pain in right shoulder: Secondary | ICD-10-CM | POA: Insufficient documentation

## 2015-12-19 DIAGNOSIS — K2970 Gastritis, unspecified, without bleeding: Secondary | ICD-10-CM | POA: Insufficient documentation

## 2015-12-19 DIAGNOSIS — I10 Essential (primary) hypertension: Secondary | ICD-10-CM | POA: Insufficient documentation

## 2015-12-19 HISTORY — DX: Essential (primary) hypertension: I10

## 2015-12-19 LAB — CBC WITH DIFFERENTIAL/PLATELET
Basophils Absolute: 0 10*3/uL (ref 0.0–0.1)
Basophils Relative: 1 %
Eosinophils Absolute: 0.1 10*3/uL (ref 0.0–0.7)
Eosinophils Relative: 1 %
HCT: 43.3 % (ref 36.0–46.0)
Hemoglobin: 14.3 g/dL (ref 12.0–15.0)
Lymphocytes Relative: 26 %
Lymphs Abs: 1.6 10*3/uL (ref 0.7–4.0)
MCH: 30.4 pg (ref 26.0–34.0)
MCHC: 33 g/dL (ref 30.0–36.0)
MCV: 92.1 fL (ref 78.0–100.0)
Monocytes Absolute: 0.3 10*3/uL (ref 0.1–1.0)
Monocytes Relative: 4 %
Neutro Abs: 4.2 10*3/uL (ref 1.7–7.7)
Neutrophils Relative %: 68 %
Platelets: 291 10*3/uL (ref 150–400)
RBC: 4.7 MIL/uL (ref 3.87–5.11)
RDW: 15.4 % (ref 11.5–15.5)
WBC: 6.1 10*3/uL (ref 4.0–10.5)

## 2015-12-19 LAB — COMPREHENSIVE METABOLIC PANEL
ALT: 15 U/L (ref 14–54)
AST: 26 U/L (ref 15–41)
Albumin: 3.7 g/dL (ref 3.5–5.0)
Alkaline Phosphatase: 68 U/L (ref 38–126)
Anion gap: 13 (ref 5–15)
BUN: 23 mg/dL — ABNORMAL HIGH (ref 6–20)
CO2: 25 mmol/L (ref 22–32)
Calcium: 10 mg/dL (ref 8.9–10.3)
Chloride: 104 mmol/L (ref 101–111)
Creatinine, Ser: 1.05 mg/dL — ABNORMAL HIGH (ref 0.44–1.00)
GFR calc Af Amer: >60 mL/min (ref >60)
GFR calc non Af Amer: 57 mL/min — ABNORMAL LOW (ref >60)
Glucose, Bld: 95 mg/dL (ref 65–99)
Potassium: 4.3 mmol/L (ref 3.5–5.1)
Sodium: 142 mmol/L (ref 135–145)
Total Bilirubin: 0.5 mg/dL (ref 0.3–1.2)
Total Protein: 7.2 g/dL (ref 6.5–8.1)

## 2015-12-19 LAB — I-STAT TROPONIN, ED: Troponin i, poc: 0 ng/mL (ref 0.00–0.08)

## 2015-12-19 LAB — LIPASE, BLOOD: Lipase: 38 U/L (ref 11–51)

## 2015-12-19 MED ORDER — OMEPRAZOLE 20 MG PO CPDR: 20.0000 mg | DELAYED_RELEASE_CAPSULE | Freq: Every day | ORAL | Status: DC

## 2015-12-19 MED ORDER — GI COCKTAIL ~~LOC~~
30.0000 mL | Freq: Once | ORAL | Status: AC
  Administered 2015-12-19: 30 mL via ORAL
  Filled 2015-12-19: qty 30

## 2015-12-19 NOTE — Discharge Instructions (Signed)
Please read attached information. If you experience any new or worsening signs or symptoms please return to the emergency room for evaluation. Please follow-up with your primary care provider or specialist as discussed. Please use medication prescribed only as directed and discontinue taking if you have any concerning signs or symptoms.   °

## 2015-12-19 NOTE — ED Notes (Signed)
Per EMS: Pt complaining of substernal chest pain. Became more severe ~45 mins ago. EMS gave 324 ASA and 1 nitro, no relief. Pt had similar episode ~ 1 year ago. Vitals stable.

## 2015-12-19 NOTE — ED Provider Notes (Signed)
CSN: [id number]     Arrival date & time 12/19/15  1555 History   First MD Initiated Contact with Patient 12/19/15 [phone]     Chief Complaint  Patient presents with  . Chest Pain   HPI   60 year old female presents today with complaints of epigastric abdominal pain, right shoulder pain. Patient reports that she's had epigastric abdominal pain with radiation into her chest for last several months. She reports this has been waxing and waning, with worsening today. She reports after using today she had pain in her epigastric region and lower chest. She reports this pain is not made worse with ambulation, deep inspiration, palpation. She notes one episode of non-bloody and non-projectile vomiting today. Patient denies any fever, chills, lower abdominal pain, diaphoresis, orthopnea, lower Cherry swelling or edema, DVT or PE risk factors. Patient reports she had an episode of chest pain last year, was admitted to the hospital with echocardiogram with cardiology with no significant findings. Patient reports that approximately one year ago she fell causing right shoulder pain. She describes this as a dull ache to the lateral aspect of her right shoulder worse with forward flexion of the upper extremity. Patient reports that she has been using approximately 5 packets of BC powder daily for the last year to relieve this pain. Patient has been able to tolerate by mouth without difficulty.     Past Medical History  Diagnosis Date  . Seasonal allergies   . Hypertension    History reviewed. No pertinent past surgical history. Family History  Problem Relation Age of Onset  . Diabetes Mother   . Heart disease Mother   . Heart attack Brother     Died at age 72 of MI  . Emphysema Father    Social History  Substance Use Topics  . Smoking status: Former Smoker -- 0.50 packs/day for 5 years    Types: Cigarettes  . Smokeless tobacco: None  . Alcohol Use: Yes   OB History    No data available     Review of  Systems  All other systems reviewed and are negative.   Allergies  Review of patient's allergies indicates no known allergies.  Home Medications   Prior to Admission medications   Medication Sig Start Date End Date Taking? Authorizing Provider  aspirin EC 81 MG tablet Take 1 tablet (81 mg total) by mouth daily. Patient not taking: Reported on 12/19/2015 12/08/14   Leatha Gilding, MD  atorvastatin (LIPITOR) 20 MG tablet Take 1 tablet (20 mg total) by mouth daily. Patient not taking: Reported on 12/19/2015 12/08/14   Leatha Gilding, MD  cyclobenzaprine (FLEXERIL) 10 MG tablet Take 1 tablet (10 mg total) by mouth 2 (two) times daily as needed for muscle spasms. Patient not taking: Reported on 12/19/2015 02/21/15   Ladona Mow, PA-C  ibuprofen (ADVIL,MOTRIN) 800 MG tablet Take 1 tablet (800 mg total) by mouth 3 (three) times daily. Patient not taking: Reported on 12/19/2015 02/21/15   Ladona Mow, PA-C  omeprazole (PRILOSEC) 20 MG capsule Take 1 capsule (20 mg total) by mouth daily. 12/19/15   Tinnie Gens Debroah Shuttleworth, PA-C   BP 122/79 mmHg  Pulse 79  Temp(Src) 99 F (37.2 C) (Oral)  Resp 20  SpO2 99%   Physical Exam  Constitutional: She is oriented to person, place, and time. She appears well-developed and well-nourished.  HENT:  Head: Normocephalic and atraumatic.  Eyes: Conjunctivae are normal. Pupils are equal, round, and reactive to light. Right eye exhibits no  discharge. Left eye exhibits no discharge. No scleral icterus.  Neck: Normal range of motion. No JVD present. No tracheal deviation present.  Cardiovascular: Normal rate, regular rhythm, normal heart sounds and intact distal pulses.  Exam reveals no gallop and no friction rub.   No murmur heard. Pulmonary/Chest: Effort normal and breath sounds normal. No stridor. No respiratory distress. She has no wheezes. She has no rales. She exhibits no tenderness.  Abdominal: Soft. She exhibits no distension and no mass. There is tenderness. There is no  rebound and no guarding.  Epigastric/ RUQ TTP   Musculoskeletal:  Pain with extension, flexion of right shoulder. Full active ROM of motion. Distal sensation, strength, cap refill intact   Neurological: She is alert and oriented to person, place, and time. Coordination normal.  Skin: Skin is warm. No erythema.  Psychiatric: She has a normal mood and affect. Her behavior is normal. Judgment and thought content normal.  Nursing note and vitals reviewed.   ED Course  Procedures (including critical care time) Labs Review Labs Reviewed  COMPREHENSIVE METABOLIC PANEL - Abnormal; Notable for the following:    BUN 23 (*)    Creatinine, Ser 1.05 (*)    GFR calc non Af Amer 57 (*)    All other components within normal limits  CBC WITH DIFFERENTIAL/PLATELET  LIPASE, BLOOD  I-STAT TROPOININ, ED    Imaging Review US Abdomen Limited Ruq  12/19/2015  CLINICAL DATA:  Substernal chest pain and right upper quadrant pain. History of hypertension. EXAM: US ABDOMEN LIMITED - RIGHT UPPER QUADRANT COMPARISON:  None. FINDINGS: Gallbladder: There are echogenic lesions protruding from the gallbladder wall, largest measuring 5 mm, consistent with polyps. There are no shadowing stones. There is no wall thickening or pericholecystic fluid. Common bile duct: Diameter: 3 mm Liver: There is subtle mottled appearance to the parenchymal echotexture. No discrete mass or lesion is seen. The etiology of this is unclear. Liver is normal in size. There is hepatopetal flow in the portal vein. IMPRESSION: 1. No acute findings. 2. Gallbladder polyps, largest measuring 5 mm.  No gallstones. 3. Subtle mottled appearance of the liver parenchymal echotexture. This may be an artifact imaging itself. However, if there are any symptoms of liver pathology, follow-up liver MRI with and without contrast would be recommended. Electronically Signed   By: Amie Portland M.D.   On: 12/19/2015 17:28   I have personally reviewed and evaluated  these images and lab results as part of my medical decision-making.   EKG Interpretation   Date/Time:  Monday December 19 2015 16:17:15 EST Ventricular Rate:  71 PR Interval:  146 QRS Duration: 71 QT Interval:  359 QTC Calculation: 390 R Axis:   71 Text Interpretation:  Sinus rhythm Left atrial enlargement RSR' in V1 or  V2, probably normal variant Probable left ventricular hypertrophy  Nonspecific T abnrm, anterolateral leads No significant change since 2016  Confirmed by GOLDSTON  MD, SCOTT ([id number]) on 12/19/2015 4:20:02 PM      MDM   Final diagnoses:  RUQ pain  Gastritis  Right shoulder pain    Labs:I-STAT troponin, CBC, CMP and a lipase  Imaging:Right upper quadrant ultrasound  Consults:  Therapeutics: GI cocktail  Discharge Meds:   Assessment/Plan:60 year old female presents today with likely gastritis. Patient has been abusing BC powders for the last year. She has pain after eating. This is unlikely cardiac or pulmonary related chest pain, more likely epigastric radiation of pain. Patient has musculoskeletal shoulder pain. Patient was given a GI  cocktail here in the ED which improved her symptoms. Patient has a heart score of 2, very low suspicion for ACS, PE, or dissection. Patient will be discharged home on Prilosec, encouraged to discontinue using BC powders, and follow up with primary care provider for reevaluation. Patient was seen by case management with follow-up resources given. Patient given strict return percussion, verbalize understanding and agreement for today's plan and had no further questions or concerns at time of discharge.        Eyvonne Mechanic, PA-C 12/19/15 1847  Pricilla Loveless, MD 12/20/15 [phone]

## 2016-12-07 ENCOUNTER — Emergency Department (HOSPITAL_COMMUNITY)
Admission: EM | Admit: 2016-12-07 | Discharge: 2016-12-07 | Disposition: A | Discharge status: Home / Self Care | Payer: Medicaid Other | Attending: Emergency Medicine | Admitting: Emergency Medicine

## 2016-12-07 ENCOUNTER — Encounter (HOSPITAL_COMMUNITY): Payer: Self-pay | Admitting: Emergency Medicine

## 2016-12-07 DIAGNOSIS — I10 Essential (primary) hypertension: Secondary | ICD-10-CM | POA: Diagnosis not present

## 2016-12-07 DIAGNOSIS — Y929 Unspecified place or not applicable: Secondary | ICD-10-CM | POA: Diagnosis not present

## 2016-12-07 DIAGNOSIS — Z87891 Personal history of nicotine dependence: Secondary | ICD-10-CM | POA: Insufficient documentation

## 2016-12-07 DIAGNOSIS — Y999 Unspecified external cause status: Secondary | ICD-10-CM | POA: Diagnosis not present

## 2016-12-07 DIAGNOSIS — Z7982 Long term (current) use of aspirin: Secondary | ICD-10-CM | POA: Insufficient documentation

## 2016-12-07 DIAGNOSIS — Z7722 Contact with and (suspected) exposure to environmental tobacco smoke (acute) (chronic): Secondary | ICD-10-CM | POA: Insufficient documentation

## 2016-12-07 DIAGNOSIS — Y939 Activity, unspecified: Secondary | ICD-10-CM | POA: Diagnosis not present

## 2016-12-07 DIAGNOSIS — M546 Pain in thoracic spine: Secondary | ICD-10-CM | POA: Diagnosis present

## 2016-12-07 DIAGNOSIS — Z79899 Other long term (current) drug therapy: Secondary | ICD-10-CM | POA: Diagnosis not present

## 2016-12-07 DIAGNOSIS — W1830XA Fall on same level, unspecified, initial encounter: Secondary | ICD-10-CM | POA: Insufficient documentation

## 2016-12-07 DIAGNOSIS — W19XXXA Unspecified fall, initial encounter: Secondary | ICD-10-CM

## 2016-12-07 MED ORDER — OXYCODONE-ACETAMINOPHEN 5-325 MG PO TABS
2.0000 | ORAL_TABLET | Freq: Once | ORAL | Status: AC
  Administered 2016-12-07: 2 via ORAL
  Filled 2016-12-07: qty 2

## 2016-12-07 MED ORDER — IBUPROFEN 400 MG PO TABS
600.0000 mg | ORAL_TABLET | Freq: Once | ORAL | Status: AC
  Administered 2016-12-07: 600 mg via ORAL
  Filled 2016-12-07: qty 1

## 2016-12-07 NOTE — Discharge Instructions (Signed)
Please take ibuprofen 600 mg and tylenol 650 three times a day for inflammation and pain.  Ice the area to decrease inflammation.   Return to emergency department if your back pain is associated with fever, bladder incontinence or retention, groin numbness or numbness or tingling of your lower extremities.

## 2016-12-07 NOTE — ED Provider Notes (Signed)
[facility]-EMERGENCY DEPT Provider Note   CSN: [id number] Arrival date & time: 12/07/16  4098  By signing my name below, I, Marnette Burgess Long, attest that this documentation has been prepared under the direction and in the presence of Liberty Handy, PA-C. Electronically Signed: Marnette Burgess Long, Scribe. 12/07/2016. 11:17 AM.   History   Chief Complaint Chief Complaint  Patient presents with  . Back Pain    HPI Bridget Reynolds is a 61 y.o. female.  The history is provided by the patient. No language interpreter was used.    HPI Comments:  Bridget Reynolds is a 61 y.o. female with a PMHx of HTN and h/o chronic back problems on work disability due to fall at work two years ago, who presents to the Emergency Department complaining of sudden onset, moderate, bilateral thoracic back pain onset last night s/p a mechanical fall at ground level in her home. She states tripping and falling backwards. Denies LOC or head injury. She did not take anything at home for relief of her symptoms. She notes standing for long periods of time and exertion exacerbate her pain.  Pt denies SOB, CP, lightheadedness, numbness, tingling, and any other complaints at this time.  No dizziness, SOB, CP prior fall.    Past Medical History:  Diagnosis Date  . Hypertension   . Seasonal allergies     Patient Active Problem List   Diagnosis Date Noted  . Hyperlipidemia 12/08/2014  . Chest pain 12/07/2014  . Chest pain, moderate coronary artery risk     History reviewed. No pertinent surgical history.  OB History    No data available       Home Medications    Prior to Admission medications   Medication Sig Start Date End Date Taking? Authorizing Provider  aspirin EC 81 MG tablet Take 1 tablet (81 mg total) by mouth daily. Patient not taking: Reported on 12/19/2015 12/08/14   Leatha Gilding, MD  atorvastatin (LIPITOR) 20 MG tablet Take 1 tablet (20 mg total) by mouth daily. Patient not taking: Reported  on 12/19/2015 12/08/14   Leatha Gilding, MD  cyclobenzaprine (FLEXERIL) 10 MG tablet Take 1 tablet (10 mg total) by mouth 2 (two) times daily as needed for muscle spasms. Patient not taking: Reported on 12/19/2015 02/21/15   Ladona Mow, PA-C  ibuprofen (ADVIL,MOTRIN) 800 MG tablet Take 1 tablet (800 mg total) by mouth 3 (three) times daily. Patient not taking: Reported on 12/19/2015 02/21/15   Ladona Mow, PA-C  omeprazole (PRILOSEC) 20 MG capsule Take 1 capsule (20 mg total) by mouth daily. 12/19/15   Eyvonne Mechanic, PA-C    Family History Family History  Problem Relation Age of Onset  . Diabetes Mother   . Heart disease Mother   . Heart attack Brother     Died at age 107 of MI  . Emphysema Father     Social History Social History  Substance Use Topics  . Smoking status: Former Smoker    Packs/day: 0.50    Years: 5.00    Types: Cigarettes  . Smokeless tobacco: Not on file  . Alcohol use Yes     Allergies   Patient has no known allergies.   Review of Systems Review of Systems  Constitutional: Negative for fever.  HENT: Negative for congestion and sore throat.   Eyes: Negative for visual disturbance.  Respiratory: Negative for cough and shortness of breath.   Cardiovascular: Negative for chest pain.  Gastrointestinal: Negative for abdominal pain, constipation,  diarrhea, nausea and vomiting.  Genitourinary: Negative for difficulty urinating.  Musculoskeletal: Positive for back pain and myalgias. Negative for arthralgias.  Neurological: Negative for dizziness, syncope, weakness, light-headedness, numbness and headaches.     Physical Exam Updated Vital Signs BP 115/78   Pulse 93   Temp 99.1 F (37.3 C) (Oral)   Resp 14   Ht 5' 8.5" (1.74 m)   Wt 54.4 kg   SpO2 98%   BMI 17.98 kg/m   Physical Exam  Constitutional: She is oriented to person, place, and time. She appears well-developed and well-nourished. No distress.  NAD.  HENT:  Head: Normocephalic and atraumatic.    Right Ear: External ear normal.  Left Ear: External ear normal.  Nose: Nose normal.  Mouth/Throat: Oropharynx is clear and moist. No oropharyngeal exudate.  Moist mucous membranes.  No nasal mucosa edema. No nasal discharge noted. Oropharynx and tonsils normal without edema, erythema, exudates or lesions.  Uvula midline. No trismus.   Eyes: Conjunctivae and EOM are normal. Pupils are equal, round, and reactive to light. No scleral icterus.  Neck: Normal range of motion. Neck supple. No JVD present.  Cardiovascular: Normal rate, regular rhythm and normal heart sounds.   No murmur heard. Pulmonary/Chest: Effort normal and breath sounds normal. She has no wheezes.  Abdominal: Soft. There is no tenderness.  Musculoskeletal: Normal range of motion. She exhibits tenderness. She exhibits no deformity.  Gait normal.  Full active ROM of CTL spine including flexion, extension, lateral bend and rotation. No midline CTL spine tenderness. Positive lumbar paraspinal muscular tenderness with increased tone.  SI joints and sciatic notch non tender.  Full passive hip, knee and ankle ROM bilaterally.  Negative SLR. Negative Faber. Negative Stinchfield test.    Lymphadenopathy:    She has no cervical adenopathy.  Neurological: She is alert and oriented to person, place, and time.  Gait normal. No foot drop.  5/5 strength with hip flexion and extension, bilaterally.  5/5 strength with knee flexion and extension, bilaterally.  5/5 strength with ankle dorsiflexion and plantar flexion, bilaterally.  Sensation to light touch intact in the distribution of the obturator nerve, lateral cutaneous nerve, femoral nerve, common fibular nerve.  2/4 knee and ankle DTR bilaterally.    Foot: sensation to light touch intact in the distribution of the saphenous nerve, medial plantar nerve, lateral plantar nerve, bilaterally.   Skin: Skin is warm and dry. Capillary refill takes less than 2 seconds.  Psychiatric: She  has a normal mood and affect. Her behavior is normal. Judgment and thought content normal.  Nursing note and vitals reviewed.    ED Treatments / Results  DIAGNOSTIC STUDIES:  Oxygen Saturation is 97% on RA, normal by my interpretation.    COORDINATION OF CARE:  11:11 AM Discussed treatment plan with pt at bedside including Percocet, Tylenol/Ibuprofen, and ice and pt agreed to plan.  Labs (all labs ordered are listed, but only abnormal results are displayed) Labs Reviewed - No data to display  EKG  EKG Interpretation None       Radiology No results found.  Procedures Procedures (including critical care time)  Medications Ordered in ED Medications  oxyCODONE-acetaminophen (PERCOCET/ROXICET) 5-325 MG per tablet 2 tablet (2 tablets Oral Given 12/07/16 1134)  ibuprofen (ADVIL,MOTRIN) tablet 600 mg (600 mg Oral Given 12/07/16 1134)     Initial Impression / Assessment and Plan / ED Course  I have reviewed the triage vital signs and the nursing notes.  Pertinent labs & imaging results  that were available during my care of the patient were reviewed by me and considered in my medical decision making (see chart for details).     Patient is a 61 y.o. female with a hx of HTN and chronic back pain due to work incident two years ago presents to the ED with traumatic bilateral thoracic back pain since last night after mechanical fall in which she landed on her middle back last night. No LOC no head trauma.  On exam pt has VSS, abdominal exam negative without guarding, rebound or rigidity.  Negative Murphy's and McBurney's.  No suprapubic or CVAT.  Musculoskeletal exam revealed bilateral thoracic paraspinal muscle tenderness to deep palpation with increased muscular tone c/w muscle spasm, bilaterally. No ecchymosis or obvious signs of injury. Normal gait and posture. Negative SLR and Faber's bilaterally.  No neurological deficits appreciated. Patient is ambulatory. Initial ddx include  *lumbar strain, psoas muscle strain or spasm and less likely ruptured disc, UTI/pyelo, PID, kidney stone, cauda equina or epidural abscess.  No red flag symptoms of back pain including: fecal incontinence, urinary retention or overflow incontinence, night sweats, waking from sleep with back pain, unexplained fevers or weight loss, h/o cancer, IVDU, recent trauma. No concern for cauda equina, epidural abscess, or other serious cause of back pain. Imaging not indicated today as abdominal and MSK exam and lab work up grossly normal.  Suspect pt experiencing muscular pain due to recent fall. Conservative measures such as ice/heat, mild stretches, ibuprofen/tylenol and h PCP follow-up if no improvement with conservative management. ED return precautions discussed with pt who verbalized understanding and was agreeable to dispo plan.    Final Clinical Impressions(s) / ED Diagnoses   Final diagnoses:  Acute bilateral thoracic back pain  Fall, initial encounter    New Prescriptions Discharge Medication List as of 12/07/2016 11:27 AM      I personally performed the services described in this documentation, which was scribed in my presence. The recorded information has been reviewed and is accurate.     Liberty Handy, PA-C 12/07/16 1154    Vanetta Mulders, MD 12/08/16 [phone]

## 2016-12-07 NOTE — ED Triage Notes (Signed)
Pt sts right sided back pian after falling yesterday; pt sts hx of chronic back problems

## 2016-12-07 NOTE — ED Notes (Signed)
Called for room x2 with no response. 

## 2017-08-09 ENCOUNTER — Encounter (HOSPITAL_COMMUNITY): Payer: Self-pay | Admitting: Emergency Medicine

## 2017-08-09 ENCOUNTER — Emergency Department (HOSPITAL_COMMUNITY): Payer: PRIVATE HEALTH INSURANCE

## 2017-08-09 ENCOUNTER — Emergency Department (HOSPITAL_COMMUNITY)
Admission: EM | Admit: 2017-08-09 | Discharge: 2017-08-09 | Disposition: A | Discharge status: Home / Self Care | Payer: PRIVATE HEALTH INSURANCE | Attending: Emergency Medicine | Admitting: Emergency Medicine

## 2017-08-09 DIAGNOSIS — Z87891 Personal history of nicotine dependence: Secondary | ICD-10-CM | POA: Insufficient documentation

## 2017-08-09 DIAGNOSIS — I10 Essential (primary) hypertension: Secondary | ICD-10-CM | POA: Insufficient documentation

## 2017-08-09 DIAGNOSIS — R072 Precordial pain: Secondary | ICD-10-CM | POA: Insufficient documentation

## 2017-08-09 DIAGNOSIS — R0789 Other chest pain: Secondary | ICD-10-CM | POA: Insufficient documentation

## 2017-08-09 DIAGNOSIS — Z79899 Other long term (current) drug therapy: Secondary | ICD-10-CM | POA: Insufficient documentation

## 2017-08-09 LAB — BASIC METABOLIC PANEL
ANION GAP: 10 (ref 5–15)
BUN: 10 mg/dL (ref 6–20)
CHLORIDE: 102 mmol/L (ref 101–111)
CO2: 23 mmol/L (ref 22–32)
Calcium: 9.4 mg/dL (ref 8.9–10.3)
Creatinine, Ser: 0.74 mg/dL (ref 0.44–1.00)
GFR calc non Af Amer: >60 mL/min (ref >60)
Glucose, Bld: 93 mg/dL (ref 65–99)
Potassium: 4.1 mmol/L (ref 3.5–5.1)
Sodium: 135 mmol/L (ref 135–145)

## 2017-08-09 LAB — CBC
HCT: 41.7 % (ref 36.0–46.0)
HEMOGLOBIN: 14.2 g/dL (ref 12.0–15.0)
MCH: 30.3 pg (ref 26.0–34.0)
MCHC: 34.1 g/dL (ref 30.0–36.0)
MCV: 89.1 fL (ref 78.0–100.0)
Platelets: 253 10*3/uL (ref 150–400)
RBC: 4.68 MIL/uL (ref 3.87–5.11)
RDW: 14.7 % (ref 11.5–15.5)
WBC: 5.3 10*3/uL (ref 4.0–10.5)

## 2017-08-09 LAB — I-STAT TROPONIN, ED: TROPONIN I, POC: 0 ng/mL (ref 0.00–0.08)

## 2017-08-09 MED ORDER — IBUPROFEN 400 MG PO TABS
400.0000 mg | ORAL_TABLET | Freq: Once | ORAL | Status: AC
  Administered 2017-08-09: 400 mg via ORAL
  Filled 2017-08-09: qty 1

## 2017-08-09 MED ORDER — ACETAMINOPHEN 500 MG PO TABS
1000.0000 mg | ORAL_TABLET | Freq: Once | ORAL | Status: AC
  Administered 2017-08-09: 1000 mg via ORAL
  Filled 2017-08-09: qty 2

## 2017-08-09 NOTE — Discharge Instructions (Addendum)
It was our pleasure to provide your ER care today - we hope that you feel better.  Your heart tests look good today.  Your chest xray was read as showing no acute process - incidental note was made of 'bilateral carotid artery calcification' - discuss with your doctor at follow up with them.   For chest discomfort, follow up closely with cardiologist as outpatient in the next 1-2 weeks - see referral - call office to arrange appointment.  Return to ER if worse, new symptoms, fevers, increased trouble breathing, recurrent or persistent chest pain, other concern.

## 2017-08-09 NOTE — ED Provider Notes (Addendum)
[facility] EMERGENCY DEPARTMENT Provider Note   CSN: [id number] Arrival date & time: 08/09/17  1344     History   Chief Complaint Chief Complaint  Patient presents with  . Chest Pain    HPI Bridget Reynolds is a 61 y.o. female.  Patient c/o mid chest pain in past day, pain constant, dull, moderate. Pain does not radiate. No associated sob, nv or diaphoresis. No pleuritic pain. No leg pain or swelling. No chest wall injury or strain. No heartburn, denies reflux. No recent surgical, immobility, trauma or travel. Indicates former smoker. No fam hx premature cad. Denies cough or uri c/o. No fever or chills.    The history is provided by the patient.  Chest Pain   Pertinent negatives include no abdominal pain, no back pain, no cough, no fever, no headaches, no palpitations and no shortness of breath.    Past Medical History:  Diagnosis Date  . Hypertension   . Seasonal allergies     Patient Active Problem List   Diagnosis Date Noted  . Hyperlipidemia 12/08/2014  . Chest pain 12/07/2014  . Chest pain, moderate coronary artery risk     History reviewed. No pertinent surgical history.  OB History    No data available       Home Medications    Prior to Admission medications   Medication Sig Start Date End Date Taking? Authorizing Provider  aspirin EC 81 MG tablet Take 1 tablet (81 mg total) by mouth daily. Patient not taking: Reported on 12/19/2015 12/08/14   Leatha Gilding, MD  atorvastatin (LIPITOR) 20 MG tablet Take 1 tablet (20 mg total) by mouth daily. Patient not taking: Reported on 12/19/2015 12/08/14   Leatha Gilding, MD  cyclobenzaprine (FLEXERIL) 10 MG tablet Take 1 tablet (10 mg total) by mouth 2 (two) times daily as needed for muscle spasms. Patient not taking: Reported on 12/19/2015 02/21/15   Ladona Mow, PA-C  ibuprofen (ADVIL,MOTRIN) 800 MG tablet Take 1 tablet (800 mg total) by mouth 3 (three) times daily. Patient not taking:  Reported on 12/19/2015 02/21/15   Ladona Mow, PA-C  omeprazole (PRILOSEC) 20 MG capsule Take 1 capsule (20 mg total) by mouth daily. 12/19/15   Eyvonne Mechanic, PA-C    Family History Family History  Problem Relation Age of Onset  . Diabetes Mother   . Heart disease Mother   . Heart attack Brother        Died at age 60 of MI  . Emphysema Father     Social History Social History  Substance Use Topics  . Smoking status: Former Smoker    Packs/day: 0.50    Years: 5.00    Types: Cigarettes  . Smokeless tobacco: Not on file  . Alcohol use Yes     Allergies   Patient has no known allergies.   Review of Systems Review of Systems  Constitutional: Negative for fever.  HENT: Negative for sore throat.   Eyes: Negative for redness.  Respiratory: Negative for cough and shortness of breath.   Cardiovascular: Positive for chest pain. Negative for palpitations and leg swelling.  Gastrointestinal: Negative for abdominal pain.  Genitourinary: Negative for flank pain.  Musculoskeletal: Negative for back pain and neck pain.  Skin: Negative for rash.  Neurological: Negative for headaches.  Hematological: Does not bruise/bleed easily.  Psychiatric/Behavioral: Negative for confusion.     Physical Exam Updated Vital Signs Pulse 73   Temp 98.3 F (36.8 C) (Oral)   Resp Marland Kitchen)  24   SpO2 96%   Physical Exam  Constitutional: She appears well-developed and well-nourished. No distress.  HENT:  Mouth/Throat: Oropharynx is clear and moist.  Eyes: Conjunctivae are normal. No scleral icterus.  Neck: Neck supple. No tracheal deviation present. No thyromegaly present.  Cardiovascular: Normal rate, regular rhythm, normal heart sounds and intact distal pulses.  Exam reveals no friction rub.   No murmur heard. Pulmonary/Chest: Effort normal and breath sounds normal. No respiratory distress. She exhibits tenderness.  Abdominal: Soft. Normal appearance and bowel sounds are normal. She exhibits no  distension. There is no tenderness.  Musculoskeletal: She exhibits no edema or tenderness.  Neurological: She is alert.  Skin: Skin is warm and dry. No rash noted. She is not diaphoretic.  Psychiatric: She has a normal mood and affect.  Nursing note and vitals reviewed.    ED Treatments / Results  Labs (all labs ordered are listed, but only abnormal results are displayed) Results for orders placed or performed during the hospital encounter of 08/09/17  Basic metabolic panel  Result Value Ref Range   Sodium 135 135 - 145 mmol/L   Potassium 4.1 3.5 - 5.1 mmol/L   Chloride 102 101 - 111 mmol/L   CO2 23 22 - 32 mmol/L   Glucose, Bld 93 65 - 99 mg/dL   BUN 10 6 - 20 mg/dL   Creatinine, Ser 4.09 0.44 - 1.00 mg/dL   Calcium 9.4 8.9 - 81.1 mg/dL   GFR calc non Af Amer >60 >60 mL/min   GFR calc Af Amer >60 >60 mL/min   Anion gap 10 5 - 15  CBC  Result Value Ref Range   WBC 5.3 4.0 - 10.5 K/uL   RBC 4.68 3.87 - 5.11 MIL/uL   Hemoglobin 14.2 12.0 - 15.0 g/dL   HCT 91.4 78.2 - 95.6 %   MCV 89.1 78.0 - 100.0 fL   MCH 30.3 26.0 - 34.0 pg   MCHC 34.1 30.0 - 36.0 g/dL   RDW 21.3 08.6 - 57.8 %   Platelets 253 150 - 400 K/uL  I-stat troponin, ED  Result Value Ref Range   Troponin i, poc 0.00 0.00 - 0.08 ng/mL   Comment 3           EKG  EKG Interpretation  Date/Time:  Friday August 09 2017 13:49:18 EDT Ventricular Rate:  75 PR Interval:    QRS Duration: 79 QT Interval:  371 QTC Calculation: 415 R Axis:   71 Text Interpretation:  Sinus rhythm Probable left ventricular hypertrophy Nonspecific T wave abnormality Confirmed by Cathren Laine ([id number]) on 08/09/2017 1:59:43 PM       Radiology Dg Chest 2 View  Result Date: 08/09/2017 CLINICAL DATA:  Chest pain and hypertension EXAM: CHEST  2 VIEW COMPARISON:  December 07, 2014 FINDINGS: There is stable mild interstitial thickening. There is no edema or consolidation. The heart size and pulmonary vascularity are normal. No adenopathy.  No evident bone lesions. There is bilateral carotid artery calcification. IMPRESSION: Stable mild interstitial thickening. No edema or consolidation. Stable cardiac silhouette. There is carotid artery calcification bilaterally. Electronically Signed   By: Bretta Bang III M.D.   On: 08/09/2017 14:42    Procedures Procedures (including critical care time)  Medications Ordered in ED Medications - No data to display   Initial Impression / Assessment and Plan / ED Course  I have reviewed the triage vital signs and the nursing notes.  Pertinent labs & imaging results that were available  during my care of the patient were reviewed by me and considered in my medical decision making (see chart for details).  Iv ns. Labs. Cxr. Ecg.  Reviewed nursing notes and prior charts for additional history.   After symptoms for past day, trop 0. Pt with reproducible chest wall tenderness.   Symptoms appear most c/w musculoskeletal pain not c/w acs.   Pt currently is comfortable, no pain, no increased wob, and appears stable for d/c.   For cp, will refer to close outpt cardiology f/u (states fam hx 'heart problems' in older age, and recent atypic cp).  Return precautions provided.     Final Clinical Impressions(s) / ED Diagnoses   Final diagnoses:  None    New Prescriptions New Prescriptions   No medications on file         Cathren LaineSteinl, Kristalyn Bergstresser, MD 08/09/17 1454

## 2017-08-09 NOTE — ED Triage Notes (Signed)
Patient from home with [facility] with complaint of chest pain and syncope. EMS reports neighbor called 911 after patient was found in hallway complaining of chest pain at which point she had a syncopal episode. Patient does not recall episode.  She states that she was awakened at 0400 this morning with a 6/10 chest heaviness that increased in severity throughout the day.  Received 324mg  aspirin and 2x SL NTG PTA. Pain 3/10 at this time, alert and oriented and in no apparent distress at this time.

## 2017-12-20 DIAGNOSIS — H524 Presbyopia: Secondary | ICD-10-CM | POA: Diagnosis not present

## 2017-12-23 ENCOUNTER — Ambulatory Visit: Payer: Self-pay | Admitting: Family Medicine

## 2017-12-23 ENCOUNTER — Ambulatory Visit (INDEPENDENT_AMBULATORY_CARE_PROVIDER_SITE_OTHER): Payer: Medicare HMO | Admitting: Internal Medicine

## 2017-12-23 ENCOUNTER — Encounter (INDEPENDENT_AMBULATORY_CARE_PROVIDER_SITE_OTHER): Payer: Self-pay

## 2017-12-23 ENCOUNTER — Encounter: Payer: Self-pay | Admitting: Internal Medicine

## 2017-12-23 ENCOUNTER — Other Ambulatory Visit: Payer: Self-pay

## 2017-12-23 ENCOUNTER — Ambulatory Visit: Payer: Self-pay | Admitting: Physician Assistant

## 2017-12-23 VITALS — BP 170/96 | HR 105 | Temp 97.9°F | Ht 68.5 in | Wt 120.5 lb

## 2017-12-23 DIAGNOSIS — G8921 Chronic pain due to trauma: Secondary | ICD-10-CM

## 2017-12-23 DIAGNOSIS — E785 Hyperlipidemia, unspecified: Secondary | ICD-10-CM

## 2017-12-23 DIAGNOSIS — F332 Major depressive disorder, recurrent severe without psychotic features: Secondary | ICD-10-CM | POA: Insufficient documentation

## 2017-12-23 DIAGNOSIS — M5137 Other intervertebral disc degeneration, lumbosacral region: Secondary | ICD-10-CM

## 2017-12-23 DIAGNOSIS — Z8249 Family history of ischemic heart disease and other diseases of the circulatory system: Secondary | ICD-10-CM

## 2017-12-23 DIAGNOSIS — M549 Dorsalgia, unspecified: Principal | ICD-10-CM

## 2017-12-23 DIAGNOSIS — R45851 Suicidal ideations: Secondary | ICD-10-CM

## 2017-12-23 DIAGNOSIS — F17210 Nicotine dependence, cigarettes, uncomplicated: Secondary | ICD-10-CM

## 2017-12-23 DIAGNOSIS — R69 Illness, unspecified: Secondary | ICD-10-CM | POA: Diagnosis not present

## 2017-12-23 DIAGNOSIS — M50322 Other cervical disc degeneration at C5-C6 level: Secondary | ICD-10-CM

## 2017-12-23 DIAGNOSIS — I10 Essential (primary) hypertension: Secondary | ICD-10-CM | POA: Insufficient documentation

## 2017-12-23 DIAGNOSIS — F419 Anxiety disorder, unspecified: Secondary | ICD-10-CM

## 2017-12-23 DIAGNOSIS — Z0000 Encounter for general adult medical examination without abnormal findings: Secondary | ICD-10-CM | POA: Insufficient documentation

## 2017-12-23 DIAGNOSIS — R683 Clubbing of fingers: Secondary | ICD-10-CM

## 2017-12-23 DIAGNOSIS — Z7289 Other problems related to lifestyle: Secondary | ICD-10-CM

## 2017-12-23 DIAGNOSIS — Z79899 Other long term (current) drug therapy: Secondary | ICD-10-CM

## 2017-12-23 DIAGNOSIS — G8929 Other chronic pain: Secondary | ICD-10-CM | POA: Insufficient documentation

## 2017-12-23 DIAGNOSIS — R450 Nervousness: Secondary | ICD-10-CM

## 2017-12-23 MED ORDER — CITALOPRAM HYDROBROMIDE 20 MG PO TABS: 20.0000 mg | ORAL_TABLET | Freq: Every day | ORAL | 2 refills | Status: DC

## 2017-12-23 NOTE — Progress Notes (Signed)
Case discussed with Dr. Wallace at the time of the visit.  We reviewed the resident's history and exam and pertinent patient test results.  I agree with the assessment, diagnosis and plan of care documented in the resident's note. 

## 2017-12-23 NOTE — Patient Instructions (Signed)
Thank you for coming to see me today. It was a pleasure. Today we talked about:   Depression: We are starting a new medication called Celexa at 20mg  daily to help with depression.  I have also placed a referral for you to speak to a counselor and they will contact you to set up an appointment.  Back Pain: You can take Tylenol and Aleve AS NEEDED to help with back pain but make sure to not take if you do not need and do not take everyday.  I have also ordered physical therapy for you.  We are checking some labs and will let you know if abnormal.  Please follow-up with us in 4 weeks to see how you are doing with your depression and to meet new primary care doctor.   If you have any questions or concerns, please do not hesitate to call the office at [phone].  Take Care,   Gwynn BurlyAndrew Caitlyne Ingham, DO

## 2017-12-23 NOTE — Assessment & Plan Note (Signed)
BP Readings from Last 3 Encounters:  12/23/17 (!) 170/96  08/09/17 133/88  12/07/16 115/78   Assessment: BP today is elevated at 170/96 although she is asymptomatic.  There is a history of HTN noted in her chart but most readings are acceptable including the 2 most recent until today's visit.  Labs checked in Oct 2018 included normal BMET and CBC.  Plan: - We discussed her elevated BP today.  We agreed not to start treatment based on her historically having well-controlled blood pressure. - This can be further monitored in a few weeks when she comes to follow up for her depression.  If BP continues to be elevated, recommend she have discussion with her new PCP regarding benefits of anti-hypertensive management.

## 2017-12-23 NOTE — Assessment & Plan Note (Signed)
Assessment: Moderately severe depression with PHQ-9 score of 18. She reports worsening depression symptoms over past 9 months with the passing of her husband, however, she has had depression even before he passed away.  She reports being on medication for depression a couple years ago but does not recall the name and stopped taking it due to cost.  Upon review of her chart, I am unable to locate which medication she is referring to.  She reports no history of suicide attempt or psychiatric admissions.  No history or symptoms of mania.  She reports passing thoughts of suicide but has no actual plan or intent of self harm.  She is interested in counseling as well as pharmacotherapy.  Plan: - Start Celexa 20mg  daily.  RTC 4 weeks to assess response and titration based on symptoms and PHQ scoring. - Refer for counseling. - Continue to follow PHQ scores at follow up.
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
## 2017-12-23 NOTE — Assessment & Plan Note (Signed)
Assessment: She is due for multiple healthcare maintenance issues for which she is agreeable to addressing.  Plan: - check HIV and HCV today - will defer further testing and/or imaging to PCP once assigned as there are currently no acute issues.

## 2017-12-24 LAB — HEPATITIS C ANTIBODY

## 2017-12-24 LAB — HIV ANTIBODY (ROUTINE TESTING W REFLEX): HIV SCREEN 4TH GENERATION: NONREACTIVE

## 2017-12-25 ENCOUNTER — Ambulatory Visit: Payer: Self-pay

## 2017-12-30 ENCOUNTER — Encounter: Payer: Self-pay | Admitting: *Deleted

## 2018-01-08 ENCOUNTER — Telehealth: Payer: Self-pay | Admitting: *Deleted

## 2018-01-21 ENCOUNTER — Encounter: Payer: Self-pay | Admitting: Internal Medicine

## 2018-01-28 ENCOUNTER — Ambulatory Visit (INDEPENDENT_AMBULATORY_CARE_PROVIDER_SITE_OTHER): Payer: Medicare HMO | Admitting: Internal Medicine

## 2018-01-28 ENCOUNTER — Other Ambulatory Visit: Payer: Self-pay

## 2018-01-28 ENCOUNTER — Encounter (INDEPENDENT_AMBULATORY_CARE_PROVIDER_SITE_OTHER): Payer: Self-pay

## 2018-01-28 VITALS — BP 123/77 | HR 98 | Temp 97.8°F | Ht 68.0 in | Wt 121.8 lb

## 2018-01-28 DIAGNOSIS — F419 Anxiety disorder, unspecified: Secondary | ICD-10-CM

## 2018-01-28 DIAGNOSIS — F332 Major depressive disorder, recurrent severe without psychotic features: Secondary | ICD-10-CM | POA: Diagnosis not present

## 2018-01-28 DIAGNOSIS — M545 Low back pain: Secondary | ICD-10-CM

## 2018-01-28 DIAGNOSIS — G8921 Chronic pain due to trauma: Secondary | ICD-10-CM

## 2018-01-28 DIAGNOSIS — Z0000 Encounter for general adult medical examination without abnormal findings: Secondary | ICD-10-CM

## 2018-01-28 DIAGNOSIS — J302 Other seasonal allergic rhinitis: Secondary | ICD-10-CM

## 2018-01-28 DIAGNOSIS — E785 Hyperlipidemia, unspecified: Secondary | ICD-10-CM

## 2018-01-28 DIAGNOSIS — R69 Illness, unspecified: Secondary | ICD-10-CM | POA: Diagnosis not present

## 2018-01-28 DIAGNOSIS — Z1211 Encounter for screening for malignant neoplasm of colon: Secondary | ICD-10-CM | POA: Diagnosis not present

## 2018-01-28 DIAGNOSIS — Z87891 Personal history of nicotine dependence: Secondary | ICD-10-CM

## 2018-01-28 DIAGNOSIS — I6529 Occlusion and stenosis of unspecified carotid artery: Secondary | ICD-10-CM | POA: Diagnosis not present

## 2018-01-28 MED ORDER — CITALOPRAM HYDROBROMIDE 20 MG PO TABS: 20.0000 mg | ORAL_TABLET | Freq: Every day | ORAL | 1 refills | Status: DC
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
## 2018-01-28 NOTE — Patient Instructions (Addendum)
It was great meeting you today. I'm glad you doing well.   Today we talked about Depression and anxiety. I'm glad you are responding well to the Celexa! I look forward to seeing how you do once the medication has reached its maximum levels. Lets continue your current regimen, and please keep working with the therapist! -Continue Celexa 20mg  daily, 6 month supply sent to pharmacy -Continue working with therapist   Today we talked about healthcare screens. I have ordered a mammogram and also a stool test which screens for colon cancer. Please stop by the lab on your way out for the card and also to get a Lipid panel.    Today we also talked about your back pain. It sounds like physical therapy did not have the correct number, so I will update them with your correct contact information.

## 2018-01-29 LAB — LIPID PANEL
CHOL/HDL RATIO: 2.3 ratio (ref 0.0–4.4)
Cholesterol, Total: 214 mg/dL — ABNORMAL HIGH (ref 100–199)
HDL: 94 mg/dL (ref >39)
LDL CALC: 100 mg/dL — AB (ref 0–99)
TRIGLYCERIDES: 99 mg/dL (ref 0–149)
VLDL CHOLESTEROL CAL: 20 mg/dL (ref 5–40)

## 2018-01-29 NOTE — Assessment & Plan Note (Addendum)
Assessment & Plan: Chart review mentions history of HLD and recent imaging demonstrated carotid atherosclerosis. Lipid panel collected today with total cholesterol 214, LDL 100 and HDL 94. Her 10-yr estimated ASCVD risk is 3%,. Will not prescribe statin at this time but did encourage lifestyle modifications including tobacco cessation and exercise.

## 2018-01-29 NOTE — Assessment & Plan Note (Signed)
Assessment & Plan: [name] is due for screening mammogram, CRC screen and PAP. CRC screen this visit negative and will not need another for 1 year - reflected this in HCM tab. Mammogram ordered today however she declined PAP as she was running late for something. Will address at our next visit.

## 2018-01-29 NOTE — Assessment & Plan Note (Addendum)
Assessment & Plan: [name] noted significant improvement in her mood and anxiety with addition of Celexa 20 mg. Unfortunately, she misunderstood the expiration date on the bottle and has not been taking this for a week. She requests refill and have provided her with 90-day Rx with 1 refill. Will reassess her depression and anxiety at our next visit and adjust regimen accordingly. She reports seeing a therapist 3x monthly and has noticed a benefit. She was encouraged to continue this.

## 2018-01-29 NOTE — Assessment & Plan Note (Signed)
Stool card provided today and was negative. Will need follow-up screen in 1 yr.

## 2018-01-30 ENCOUNTER — Other Ambulatory Visit: Payer: Self-pay | Admitting: Internal Medicine

## 2018-01-30 DIAGNOSIS — Z1231 Encounter for screening mammogram for malignant neoplasm of breast: Secondary | ICD-10-CM

## 2018-02-03 DIAGNOSIS — Z1211 Encounter for screening for malignant neoplasm of colon: Secondary | ICD-10-CM | POA: Diagnosis not present

## 2018-02-03 NOTE — Progress Notes (Signed)
Internal Medicine Clinic Attending  Case discussed with Dr. Molt at the time of the visit.  We reviewed the resident's history and exam and pertinent patient test results.  I agree with the assessment, diagnosis, and plan of care documented in the resident's note. 

## 2018-02-10 ENCOUNTER — Ambulatory Visit: Payer: Medicare HMO | Attending: Internal Medicine | Admitting: Physical Therapy

## 2018-02-10 NOTE — Addendum Note (Signed)
Addended by: Neomia DearPOWERS, [place] Leinen E on: 02/10/2018 05:47 PM   Modules accepted: Orders

## 2018-02-12 ENCOUNTER — Other Ambulatory Visit: Payer: Medicare HMO

## 2018-02-12 DIAGNOSIS — Z1211 Encounter for screening for malignant neoplasm of colon: Secondary | ICD-10-CM

## 2018-02-13 LAB — FECAL OCCULT BLOOD, IMMUNOCHEMICAL: Fecal Occult Bld: NEGATIVE

## 2018-02-21 ENCOUNTER — Ambulatory Visit: Payer: Self-pay

## 2018-03-13 ENCOUNTER — Emergency Department (HOSPITAL_COMMUNITY): Payer: Medicare HMO

## 2018-03-13 ENCOUNTER — Encounter (HOSPITAL_COMMUNITY): Payer: Self-pay

## 2018-03-13 ENCOUNTER — Emergency Department (HOSPITAL_COMMUNITY)
Admission: EM | Admit: 2018-03-13 | Discharge: 2018-03-13 | Disposition: A | Discharge status: Home / Self Care | Payer: Medicare HMO | Attending: Emergency Medicine | Admitting: Emergency Medicine

## 2018-03-13 ENCOUNTER — Other Ambulatory Visit: Payer: Self-pay

## 2018-03-13 DIAGNOSIS — R4182 Altered mental status, unspecified: Secondary | ICD-10-CM | POA: Diagnosis not present

## 2018-03-13 DIAGNOSIS — M47812 Spondylosis without myelopathy or radiculopathy, cervical region: Secondary | ICD-10-CM | POA: Insufficient documentation

## 2018-03-13 DIAGNOSIS — Z79899 Other long term (current) drug therapy: Secondary | ICD-10-CM | POA: Insufficient documentation

## 2018-03-13 DIAGNOSIS — W182XXA Fall in (into) shower or empty bathtub, initial encounter: Secondary | ICD-10-CM | POA: Diagnosis not present

## 2018-03-13 DIAGNOSIS — I10 Essential (primary) hypertension: Secondary | ICD-10-CM | POA: Insufficient documentation

## 2018-03-13 DIAGNOSIS — R102 Pelvic and perineal pain: Secondary | ICD-10-CM | POA: Diagnosis not present

## 2018-03-13 DIAGNOSIS — T148XXA Other injury of unspecified body region, initial encounter: Secondary | ICD-10-CM | POA: Insufficient documentation

## 2018-03-13 DIAGNOSIS — Y93E1 Activity, personal bathing and showering: Secondary | ICD-10-CM | POA: Insufficient documentation

## 2018-03-13 DIAGNOSIS — T07XXXA Unspecified multiple injuries, initial encounter: Secondary | ICD-10-CM

## 2018-03-13 DIAGNOSIS — W19XXXA Unspecified fall, initial encounter: Secondary | ICD-10-CM

## 2018-03-13 DIAGNOSIS — M4692 Unspecified inflammatory spondylopathy, cervical region: Secondary | ICD-10-CM | POA: Diagnosis not present

## 2018-03-13 DIAGNOSIS — S3993XA Unspecified injury of pelvis, initial encounter: Secondary | ICD-10-CM | POA: Diagnosis not present

## 2018-03-13 DIAGNOSIS — R69 Illness, unspecified: Secondary | ICD-10-CM | POA: Diagnosis not present

## 2018-03-13 DIAGNOSIS — Y92002 Bathroom of unspecified non-institutional (private) residence single-family (private) house as the place of occurrence of the external cause: Secondary | ICD-10-CM | POA: Insufficient documentation

## 2018-03-13 DIAGNOSIS — S199XXA Unspecified injury of neck, initial encounter: Secondary | ICD-10-CM | POA: Diagnosis not present

## 2018-03-13 DIAGNOSIS — R55 Syncope and collapse: Secondary | ICD-10-CM | POA: Diagnosis not present

## 2018-03-13 DIAGNOSIS — Y999 Unspecified external cause status: Secondary | ICD-10-CM | POA: Insufficient documentation

## 2018-03-13 DIAGNOSIS — S299XXA Unspecified injury of thorax, initial encounter: Secondary | ICD-10-CM | POA: Diagnosis not present

## 2018-03-13 DIAGNOSIS — M542 Cervicalgia: Secondary | ICD-10-CM | POA: Diagnosis present

## 2018-03-13 DIAGNOSIS — S069X9A Unspecified intracranial injury with loss of consciousness of unspecified duration, initial encounter: Secondary | ICD-10-CM | POA: Diagnosis not present

## 2018-03-13 DIAGNOSIS — F17210 Nicotine dependence, cigarettes, uncomplicated: Secondary | ICD-10-CM | POA: Diagnosis not present

## 2018-03-13 LAB — CBC WITH DIFFERENTIAL/PLATELET
Abs Immature Granulocytes: 0 10*3/uL (ref 0.0–0.1)
Basophils Absolute: 0.1 10*3/uL (ref 0.0–0.1)
Basophils Relative: 1 %
EOS ABS: 0.1 10*3/uL (ref 0.0–0.7)
EOS PCT: 1 %
HEMATOCRIT: 42.9 % (ref 36.0–46.0)
HEMOGLOBIN: 14.4 g/dL (ref 12.0–15.0)
Immature Granulocytes: 0 %
LYMPHS ABS: 1.4 10*3/uL (ref 0.7–4.0)
LYMPHS PCT: 30 %
MCH: 30.4 pg (ref 26.0–34.0)
MCHC: 33.6 g/dL (ref 30.0–36.0)
MCV: 90.7 fL (ref 78.0–100.0)
MONO ABS: 0.4 10*3/uL (ref 0.1–1.0)
MONOS PCT: 7 %
Neutro Abs: 2.9 10*3/uL (ref 1.7–7.7)
Neutrophils Relative %: 61 %
Platelets: 265 10*3/uL (ref 150–400)
RBC: 4.73 MIL/uL (ref 3.87–5.11)
RDW: 14.1 % (ref 11.5–15.5)
WBC: 4.9 10*3/uL (ref 4.0–10.5)

## 2018-03-13 LAB — URINALYSIS, ROUTINE W REFLEX MICROSCOPIC
BILIRUBIN URINE: NEGATIVE
Glucose, UA: NEGATIVE mg/dL
Hgb urine dipstick: NEGATIVE
Ketones, ur: NEGATIVE mg/dL
NITRITE: NEGATIVE
Protein, ur: NEGATIVE mg/dL
SPECIFIC GRAVITY, URINE: 1.003 — AB (ref 1.005–1.030)
pH: 5 (ref 5.0–8.0)

## 2018-03-13 LAB — COMPREHENSIVE METABOLIC PANEL
ALT: 16 U/L (ref 14–54)
AST: 26 U/L (ref 15–41)
Albumin: 3.8 g/dL (ref 3.5–5.0)
Alkaline Phosphatase: 84 U/L (ref 38–126)
Anion gap: 10 (ref 5–15)
BUN: 14 mg/dL (ref 6–20)
CALCIUM: 9.6 mg/dL (ref 8.9–10.3)
CHLORIDE: 107 mmol/L (ref 101–111)
CO2: 23 mmol/L (ref 22–32)
CREATININE: 0.73 mg/dL (ref 0.44–1.00)
GFR calc Af Amer: >60 mL/min (ref >60)
GFR calc non Af Amer: >60 mL/min (ref >60)
GLUCOSE: 91 mg/dL (ref 65–99)
Potassium: 4.2 mmol/L (ref 3.5–5.1)
SODIUM: 140 mmol/L (ref 135–145)
Total Bilirubin: 0.6 mg/dL (ref 0.3–1.2)
Total Protein: 7 g/dL (ref 6.5–8.1)

## 2018-03-13 LAB — RAPID URINE DRUG SCREEN, HOSP PERFORMED
Amphetamines: NOT DETECTED
BENZODIAZEPINES: NOT DETECTED
Barbiturates: NOT DETECTED
COCAINE: NOT DETECTED
Opiates: NOT DETECTED
TETRAHYDROCANNABINOL: NOT DETECTED

## 2018-03-13 LAB — ETHANOL: Alcohol, Ethyl (B): 63 mg/dL — ABNORMAL HIGH (ref <10)

## 2018-03-13 MED ORDER — VITAMIN B-1 100 MG PO TABS: 100.0000 mg | ORAL_TABLET | Freq: Every day | ORAL | Status: DC

## 2018-03-13 MED ORDER — LORAZEPAM 1 MG PO TABS: 0.0000 mg | ORAL_TABLET | Freq: Two times a day (BID) | ORAL | Status: DC

## 2018-03-13 MED ORDER — THIAMINE HCL 100 MG/ML IJ SOLN
100.0000 mg | Freq: Every day | INTRAMUSCULAR | Status: DC
  Administered 2018-03-13: 100 mg via INTRAVENOUS
  Filled 2018-03-13: qty 2

## 2018-03-13 MED ORDER — LORAZEPAM 2 MG/ML IJ SOLN
0.0000 mg | Freq: Four times a day (QID) | INTRAMUSCULAR | Status: DC
  Administered 2018-03-13: 1 mg via INTRAVENOUS
  Filled 2018-03-13: qty 1

## 2018-03-13 MED ORDER — LORAZEPAM 1 MG PO TABS: 0.0000 mg | ORAL_TABLET | Freq: Four times a day (QID) | ORAL | Status: DC

## 2018-03-13 MED ORDER — SODIUM CHLORIDE 0.9 % IV BOLUS
1000.0000 mL | Freq: Once | INTRAVENOUS | Status: AC
  Administered 2018-03-13: 1000 mL via INTRAVENOUS

## 2018-03-13 MED ORDER — LORAZEPAM 2 MG/ML IJ SOLN: 0.0000 mg | Freq: Two times a day (BID) | INTRAMUSCULAR | Status: DC

## 2018-03-13 NOTE — ED Provider Notes (Signed)
[facility] EMERGENCY DEPARTMENT Provider Note   CSN: [id number] Arrival date & time: 03/13/18  1720     History   Chief Complaint Chief Complaint  Patient presents with  . Fall    HPI Bridget Reynolds is a 62 y.o. female.  HPI   She presents by EMS for evaluation of fall, in her home.  EMS notes her home was very "hot."  On arrival she asked to leave.  She agrees to stay for evaluation.  She states that she hurts "all over."  She is unable to give additional history.   Level 5 caveat-poor historian  Past Medical History:  Diagnosis Date  . Depression   . Hyperlipidemia   . Hypertension   . Seasonal allergies   . Tobacco use     Patient Active Problem List   Diagnosis Date Noted  . Screening for malignant neoplasm of colon 01/28/2018  . Chronic back pain 12/23/2017  . Severe episode of recurrent major depressive disorder, without psychotic features (HCC) 12/23/2017  . Healthcare maintenance 12/23/2017  . Hypertension 12/23/2017  . Hyperlipidemia 12/08/2014    History reviewed. No pertinent surgical history.   OB History   None      Home Medications    Prior to Admission medications   Medication Sig Start Date End Date Taking? Authorizing Provider  citalopram (CELEXA) 20 MG tablet Take 1 tablet (20 mg total) by mouth daily. Patient not taking: Reported on 03/13/2018 01/28/18 01/28/19  Molt, Toma Copier, DO    Family History Family History  Problem Relation Age of Onset  . Diabetes Mother   . Heart disease Mother   . Hypertension Mother   . Heart attack Brother        Died at age 40 of MI  . Emphysema Father     Social History Social History   Tobacco Use  . Smoking status: Current Some Day Smoker    Packs/day: 0.50    Years: 5.00    Pack years: 2.50    Types: Cigarettes  . Smokeless tobacco: Never Used  . Tobacco comment: SMOKES ABOUT 6 CIGARETTES A WEEK  Substance Use Topics  . Alcohol use: Yes    Comment: 2 beers per day  per patient   . Drug use: No     Allergies   Patient has no known allergies.   Review of Systems Review of Systems  Unable to perform ROS: Mental status change     Physical Exam Updated Vital Signs BP (!) 134/98   Pulse 90   Temp 98.9 F (37.2 C) (Rectal)   Resp 18   Ht  (1.727 m)   Wt 54.9 kg (121 lb)   SpO2 99%   BMI 18.40 kg/m   Physical Exam  Constitutional: She appears well-developed. No distress.  Frail  HENT:  Head: Normocephalic and atraumatic.  Right Ear: External ear normal.  Left Ear: External ear normal.  Eyes: Pupils are equal, round, and reactive to light. EOM are normal.  Mild bilateral conjunctival injection.  Neck: Normal range of motion and phonation normal. Neck supple.  Cardiovascular: Normal rate, regular rhythm and normal heart sounds.  Pulmonary/Chest: Effort normal and breath sounds normal. She exhibits no bony tenderness.  Abdominal: Soft. There is no tenderness.  Musculoskeletal:  Normal range of motion both arms and right leg.  Decreased motion left leg secondary to left hip pain on passive movement.  Neurological: She is alert. No cranial nerve deficit or sensory deficit. She  exhibits normal muscle tone. Coordination normal.  Dysarthric  Skin: Skin is warm, dry and intact.  Psychiatric: Judgment normal.  Anxious, somewhat agitated.  Nursing note and vitals reviewed.    ED Treatments / Results  Labs (all labs ordered are listed, but only abnormal results are displayed) Labs Reviewed  ETHANOL - Abnormal; Notable for the following components:      Result Value   Alcohol, Ethyl (B) 63 (*)    All other components within normal limits  URINALYSIS, ROUTINE W REFLEX MICROSCOPIC - Abnormal; Notable for the following components:   Color, Urine STRAW (*)    Specific Gravity, Urine 1.003 (*)    Leukocytes, UA SMALL (*)    Bacteria, UA RARE (*)    All other components within normal limits  COMPREHENSIVE METABOLIC PANEL  CBC WITH  DIFFERENTIAL/PLATELET  RAPID URINE DRUG SCREEN, HOSP PERFORMED    EKG None     Date: 03/13/18  Rate: 98  Rhythm: normal sinus rhythm  QRS Axis: normal  PR and QT Intervals: normal  ST/T Wave abnormalities: normal  PR and QRS Conduction Disutrbances:none  Narrative Interpretation:   Old EKG Reviewed: unchanged   Radiology Dg Chest 2 View  Result Date: 03/13/2018 CLINICAL DATA:  Patient fell today and has left-sided pelvic pain. Smoker. EXAM: CHEST - 2 VIEW COMPARISON:  08/09/2017 FINDINGS: The heart size and mediastinal contours are within normal limits. Mild pulmonary vascular congestion without acute pulmonary consolidation, effusion or pneumothorax. No acute displaced rib fracture. Probable mild osteoarthritic change at the costovertebral junction of the left eleventh rib accounting for increased sclerotic density. No definite pulmonary mass or nodule. IMPRESSION: Mild vascular congestion.  No active pulmonary disease. Electronically Signed   By: Tollie Eth M.D.   On: 03/13/2018 19:26   Dg Pelvis 1-2 Views  Result Date: 03/13/2018 CLINICAL DATA:  Patient fell and has pelvic pain on the left. EXAM: PELVIS - 1-2 VIEW COMPARISON:  Lumbar spine radiographs from 11/30/2013. FINDINGS: Lower lumbar facet arthropathy and degenerative disc disease. Stable sclerotic density in the left iliac bone likely representing a bone island. Osteoarthritis of both SI joints with sclerosis and spurring. Degenerative change about the pubic symphysis also noted with sclerosis. No acute pelvic fracture is identified. The pubic rami appear intact. No proximal femoral fracture is seen.Spurring at the femoral head-neck juncture is noted of both femora. Maintained hip joints without significant joint space narrowing. IMPRESSION: No acute osseous abnormality of the pelvis and either hip. Lower lumbar facet arthropathy. 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 Electronically Signed   By: Tollie Eth M.D.   On:  03/13/2018 19:23   Ct Head Wo Contrast  Result Date: 03/13/2018 CLINICAL DATA:  Patient fell getting out of shower. Loss of consciousness. EXAM: CT HEAD WITHOUT CONTRAST CT CERVICAL SPINE WITHOUT CONTRAST TECHNIQUE: Multidetector CT imaging of the head and cervical spine was performed following the standard protocol without intravenous contrast. Multiplanar CT image reconstructions of the cervical spine were also generated. COMPARISON:  Cervical spine radiographs 11/30/2013 FINDINGS: CT HEAD FINDINGS Brain: Mild involutional changes of the brain, age appropriate with minimal small vessel ischemic disease. No acute large vascular territory infarct, hemorrhage or midline shift. No hydrocephalus. Midline fourth ventricle and basal cisterns without effacement. No intra-axial mass nor extra-axial fluid collections. Vascular: No hyperdense vessel sign. Minimal atherosclerosis of the carotid siphons. Skull: No acute calvarial fracture. Sinuses/Orbits: Intact bilateral orbits and globes. Minimal mucosal thickening of the ethmoid and right maxillary sinuses. Other: Clear  mastoids. CT CERVICAL SPINE FINDINGS Alignment: Straightening of cervical lordosis likely due to patient position or muscle spasm. Skull base and vertebrae: No acute fracture. No primary bone lesion or focal pathologic process. Soft tissues and spinal canal: No prevertebral fluid or swelling. No visible canal hematoma. Disc levels: Moderate disc flattening C4-5, C5-6 and C6-7 with mild uncinate spurring due to uncovertebral joint osteoarthritis on the left at C4-5 and bilaterally at C5-6 and C6-7. These contribute to mild foraminal encroachment at C5-6 bilaterally. No significant central canal stenosis. Upper chest: Centrilobular emphysema bilaterally. No dominant mass or pulmonary consolidations. Other: Moderate bilateral extracranial carotid arteriosclerosis at the bifurcation. IMPRESSION: 1. No acute intracranial abnormality. Mild age related  involutional changes of the brain with minimal small vessel ischemic disease. 2. No acute cervical spine fracture or posttraumatic listhesis. 3. Mild degenerative disc disease C4-5, C5-6 and C6-7 with associated uncovertebral joint osteoarthritis. Electronically Signed   By: Tollie Eth M.D.   On: 03/13/2018 19:37   Ct Cervical Spine Wo Contrast  Result Date: 03/13/2018 CLINICAL DATA:  Patient fell getting out of shower. Loss of consciousness. EXAM: CT HEAD WITHOUT CONTRAST CT CERVICAL SPINE WITHOUT CONTRAST TECHNIQUE: Multidetector CT imaging of the head and cervical spine was performed following the standard protocol without intravenous contrast. Multiplanar CT image reconstructions of the cervical spine were also generated. COMPARISON:  Cervical spine radiographs 11/30/2013 FINDINGS: CT HEAD FINDINGS Brain: Mild involutional changes of the brain, age appropriate with minimal small vessel ischemic disease. No acute large vascular territory infarct, hemorrhage or midline shift. No hydrocephalus. Midline fourth ventricle and basal cisterns without effacement. No intra-axial mass nor extra-axial fluid collections. Vascular: No hyperdense vessel sign. Minimal atherosclerosis of the carotid siphons. Skull: No acute calvarial fracture. Sinuses/Orbits: Intact bilateral orbits and globes. Minimal mucosal thickening of the ethmoid and right maxillary sinuses. Other: Clear mastoids. CT CERVICAL SPINE FINDINGS Alignment: Straightening of cervical lordosis likely due to patient position or muscle spasm. Skull base and vertebrae: No acute fracture. No primary bone lesion or focal pathologic process. Soft tissues and spinal canal: No prevertebral fluid or swelling. No visible canal hematoma. Disc levels: Moderate disc flattening C4-5, C5-6 and C6-7 with mild uncinate spurring due to uncovertebral joint osteoarthritis on the left at C4-5 and bilaterally at C5-6 and C6-7. These contribute to mild foraminal encroachment at  C5-6 bilaterally. No significant central canal stenosis. Upper chest: Centrilobular emphysema bilaterally. No dominant mass or pulmonary consolidations. Other: Moderate bilateral extracranial carotid arteriosclerosis at the bifurcation. IMPRESSION: 1. No acute intracranial abnormality. Mild age related involutional changes of the brain with minimal small vessel ischemic disease. 2. No acute cervical spine fracture or posttraumatic listhesis. 3. Mild degenerative disc disease C4-5, C5-6 and C6-7 with associated uncovertebral joint osteoarthritis. Electronically Signed   By: Tollie Eth M.D.   On: 03/13/2018 19:37    Procedures Procedures (including critical care time)  Medications Ordered in ED Medications  sodium chloride 0.9 % bolus 1,000 mL (0 mLs Intravenous Stopped 03/13/18 2220)     Initial Impression / Assessment and Plan / ED Course  I have reviewed the triage vital signs and the nursing notes.  Pertinent labs & imaging results that were available during my care of the patient were reviewed by me and considered in my medical decision making (see chart for details).  Clinical Course as of Mar 14 1521  Thu Mar 13, 2018  2121 high  Ethanol(!) [EW]  2121 normal  CBC with Differential [EW]  2121 normal  Urine rapid drug screen (hosp performed) [EW]  2122 normal  Comprehensive metabolic panel [EW]  2122 Normal except LE increased  Urinalysis, Routine w reflex microscopic(!) [EW]  2122 Normal, images reviewed  CT Head Wo Contrast [EW]  2123 Djd present, images reviewed  CT Cervical Spine Wo Contrast [EW]  2123 Vascular congestion images reviewed,   DG Chest 2 View [EW]  Fri Mar 14, 2018  1518 No fracture, images reviewed  DG Pelvis 1-2 Views [EW]    Clinical Course User Index [EW] Mancel Bale, MD     Patient Vitals for the past 24 hrs:  BP Temp Temp src Pulse Resp SpO2 Height Weight  03/13/18 2031 (!) 134/98 - - 90 18 99 % - -  03/13/18 1939 127/85 - - 93 18 96 % - -    03/13/18 1744 - 98.9 F (37.2 C) Rectal - - - - -  03/13/18 1736 (!) 148/91 98.8 F (37.1 C) Oral 91 16 95 % - -  03/13/18 1726 - - - - - -  (1.727 m) 54.9 kg (121 lb)  03/13/18 1723 - - - - - 96 % - -    At Discharge- reevaluation with update and discussion. After initial assessment and treatment, an updated evaluation reveals she is comfortable not dysarthric and cooperative.  Family members are with her.  Findings discussed and questions answered. Mancel Bale   Medical Decision Making: Reported fall without serious injury.  Patient somewhat intoxicated on arrival, improved at discharge  Doubt intracranial bleeding, spinal fracture or spinal myelopathy.  No evidence for unstable infectious or metabolic process.  No indication for further evaluation or hospitalization.  CRITICAL CARE-no Performed by: Mancel Bale   Nursing Notes Reviewed/ Care Coordinated Applicable Imaging Reviewed Interpretation of Laboratory Data incorporated into ED treatment  The patient appears reasonably screened and/or stabilized for discharge and I doubt any other medical condition or other [facility] requiring further screening, evaluation, or treatment in the ED at this time prior to discharge.  Plan: Home Medications-OTC analgesia PRN; Home Treatments-rest, cryotherapy if needed, gradually advance diet; return here if the recommended treatment, does not improve the symptoms; Recommended follow up-PCP,.    Final Clinical Impressions(s) / ED Diagnoses   Final diagnoses:  Fall, initial encounter  Osteoarthritis of cervical spine, unspecified spinal osteoarthritis complication status  Contusion, multiple sites    ED Discharge Orders    None       Mancel Bale, MD 03/14/18 1523

## 2018-03-13 NOTE — Discharge Instructions (Addendum)
There were no serious injuries from the fall.  You have some arthritis in your neck that may be causing some difficulty.  For pain use Tylenol or Motrin.  See the doctor of your choice as needed for problems.

## 2018-03-13 NOTE — ED Notes (Signed)
C-collar removed and [place] sandwich/Coke given.

## 2018-03-13 NOTE — ED Triage Notes (Signed)
Pt arrived via [facility] from home after a fall in the shower. EMS reports that it was hot in the home and question heat related syncope. Also report open and cold beer near by. Pt reports she has drank two beers today. ETOH on breath. Pt is altered per family. Tender spinal and placed in C-Coller. C/O Chest pain and back pain. Alert to self. Repeating phrases.

## 2018-03-24 ENCOUNTER — Emergency Department (HOSPITAL_COMMUNITY): Payer: Medicare HMO

## 2018-03-24 ENCOUNTER — Encounter (HOSPITAL_COMMUNITY): Payer: Self-pay

## 2018-03-24 ENCOUNTER — Other Ambulatory Visit: Payer: Self-pay

## 2018-03-24 ENCOUNTER — Emergency Department (HOSPITAL_COMMUNITY)
Admission: EM | Admit: 2018-03-24 | Discharge: 2018-03-25 | Disposition: A | Discharge status: Home / Self Care | Payer: Medicare HMO | Attending: Emergency Medicine | Admitting: Emergency Medicine

## 2018-03-24 DIAGNOSIS — F10120 Alcohol abuse with intoxication, uncomplicated: Secondary | ICD-10-CM | POA: Insufficient documentation

## 2018-03-24 DIAGNOSIS — E161 Other hypoglycemia: Secondary | ICD-10-CM | POA: Diagnosis not present

## 2018-03-24 DIAGNOSIS — I10 Essential (primary) hypertension: Secondary | ICD-10-CM | POA: Insufficient documentation

## 2018-03-24 DIAGNOSIS — R0602 Shortness of breath: Secondary | ICD-10-CM | POA: Diagnosis not present

## 2018-03-24 DIAGNOSIS — R69 Illness, unspecified: Secondary | ICD-10-CM | POA: Diagnosis not present

## 2018-03-24 DIAGNOSIS — F17210 Nicotine dependence, cigarettes, uncomplicated: Secondary | ICD-10-CM | POA: Insufficient documentation

## 2018-03-24 DIAGNOSIS — R0789 Other chest pain: Secondary | ICD-10-CM | POA: Insufficient documentation

## 2018-03-24 DIAGNOSIS — R410 Disorientation, unspecified: Secondary | ICD-10-CM | POA: Diagnosis not present

## 2018-03-24 DIAGNOSIS — S199XXA Unspecified injury of neck, initial encounter: Secondary | ICD-10-CM | POA: Diagnosis not present

## 2018-03-24 DIAGNOSIS — R471 Dysarthria and anarthria: Secondary | ICD-10-CM | POA: Insufficient documentation

## 2018-03-24 DIAGNOSIS — R079 Chest pain, unspecified: Secondary | ICD-10-CM | POA: Diagnosis not present

## 2018-03-24 DIAGNOSIS — F10920 Alcohol use, unspecified with intoxication, uncomplicated: Secondary | ICD-10-CM

## 2018-03-24 DIAGNOSIS — E785 Hyperlipidemia, unspecified: Secondary | ICD-10-CM | POA: Diagnosis not present

## 2018-03-24 DIAGNOSIS — R402441 Other coma, without documented Glasgow coma scale score, or with partial score reported, in the field [EMT or ambulance]: Secondary | ICD-10-CM | POA: Diagnosis not present

## 2018-03-24 DIAGNOSIS — S0990XA Unspecified injury of head, initial encounter: Secondary | ICD-10-CM | POA: Diagnosis not present

## 2018-03-24 LAB — CBC
HCT: 44.2 % (ref 36.0–46.0)
Hemoglobin: 14.9 g/dL (ref 12.0–15.0)
MCH: 30.5 pg (ref 26.0–34.0)
MCHC: 33.7 g/dL (ref 30.0–36.0)
MCV: 90.6 fL (ref 78.0–100.0)
PLATELETS: 299 10*3/uL (ref 150–400)
RBC: 4.88 MIL/uL (ref 3.87–5.11)
RDW: 14.4 % (ref 11.5–15.5)
WBC: 5.2 10*3/uL (ref 4.0–10.5)

## 2018-03-24 LAB — URINALYSIS, ROUTINE W REFLEX MICROSCOPIC
Bilirubin Urine: NEGATIVE
GLUCOSE, UA: NEGATIVE mg/dL
Hgb urine dipstick: NEGATIVE
KETONES UR: NEGATIVE mg/dL
Nitrite: NEGATIVE
PH: 5 (ref 5.0–8.0)
Protein, ur: NEGATIVE mg/dL
Specific Gravity, Urine: 1.002 — ABNORMAL LOW (ref 1.005–1.030)

## 2018-03-24 LAB — RAPID URINE DRUG SCREEN, HOSP PERFORMED
Amphetamines: NOT DETECTED
BARBITURATES: NOT DETECTED
Benzodiazepines: NOT DETECTED
Cocaine: NOT DETECTED
Opiates: NOT DETECTED
TETRAHYDROCANNABINOL: POSITIVE — AB

## 2018-03-24 LAB — BASIC METABOLIC PANEL
Anion gap: 12 (ref 5–15)
BUN: 9 mg/dL (ref 6–20)
CALCIUM: 9.2 mg/dL (ref 8.9–10.3)
CO2: 20 mmol/L — ABNORMAL LOW (ref 22–32)
CREATININE: 0.68 mg/dL (ref 0.44–1.00)
Chloride: 110 mmol/L (ref 101–111)
Glucose, Bld: 84 mg/dL (ref 65–99)
Potassium: 4.3 mmol/L (ref 3.5–5.1)
SODIUM: 142 mmol/L (ref 135–145)

## 2018-03-24 LAB — I-STAT TROPONIN, ED: TROPONIN I, POC: 0 ng/mL (ref 0.00–0.08)

## 2018-03-24 LAB — I-STAT BETA HCG BLOOD, ED (MC, WL, AP ONLY)

## 2018-03-24 LAB — ETHANOL: Alcohol, Ethyl (B): 148 mg/dL — ABNORMAL HIGH (ref <10)

## 2018-03-24 MED ORDER — ACETAMINOPHEN 325 MG PO TABS
650.0000 mg | ORAL_TABLET | Freq: Once | ORAL | Status: AC
  Administered 2018-03-24: 650 mg via ORAL
  Filled 2018-03-24: qty 2

## 2018-03-24 MED ORDER — THIAMINE HCL 100 MG/ML IJ SOLN
Freq: Once | INTRAVENOUS | Status: AC
  Administered 2018-03-24: 23:00:00 via INTRAVENOUS
  Filled 2018-03-24: qty 1000

## 2018-03-24 NOTE — ED Provider Notes (Signed)
  Face-to-face evaluation   History: Alert patient who states that she has been having chest pain, depression decreased appetite for several days.  Her brother is here with her and states that she has been drinking alcohol today.  He found her on the floor at her home today.  Physical exam: Alert, calm, cooperative.  Mild dysarthria.  Heart regular rate and rhythm without murmur lungs clear anteriorly.  Medical screening examination/treatment/procedure(s) were conducted as a shared visit with non-physician practitioner(s) and myself.  I personally evaluated the patient during the encounter    Mancel BaleWentz, Victoriya Pol, MD 04/01/18 1214

## 2018-03-24 NOTE — ED Notes (Signed)
c-collar removed. OK per PA [place] ScrapeMurray

## 2018-03-24 NOTE — ED Triage Notes (Signed)
Pt BIB [facility] for eval of syncopal episode, chest pain and ETOH intox. EMS reports pt was found down in lobby, woke to touch. Pt reports chest pain x 2 days. "not feeling right" since she fell 2 weeks ago. Pt is poor historian and unable to provide detailed history. Continues to c/o chest pain on arrival, pt in c-collar due to unknown fall/MOI. Uncooperative w/ c-collar and continues to self remove.

## 2018-03-24 NOTE — Progress Notes (Addendum)
CSW consulted to provide outpatient therapeutic resources for pt. Pt is experiencing feelings of depression, but does not have feelings of SI/HI. CSW provided pt with mobile crisis lines and the [employer] hotline and provided education on what they provide. CSW gave pt list of outpatient therapy providers within [place], along with what insurance they take. CSW highlighted providers accepting pt's insurance. CSW provided pt with [facility] brochure and reviewed services and brochure with pt. Pt was thankful and agreed to following up with outpatient providers.   [facility] Pt would like to return there. Pt was seeing Ms. Yetta BarreJones. Pt gave verbal permission for CSW to call Ms. Yetta BarreJones to see if an appointment can be scheduled. CSW encouraged pt to do the same.    Bridget Reynolds, Bridget LayLCSWA [facility]  [place]

## 2018-03-25 DIAGNOSIS — R471 Dysarthria and anarthria: Secondary | ICD-10-CM | POA: Diagnosis not present

## 2018-03-25 DIAGNOSIS — R0789 Other chest pain: Secondary | ICD-10-CM | POA: Diagnosis not present

## 2018-03-25 DIAGNOSIS — R69 Illness, unspecified: Secondary | ICD-10-CM | POA: Diagnosis not present

## 2018-03-25 DIAGNOSIS — E785 Hyperlipidemia, unspecified: Secondary | ICD-10-CM | POA: Diagnosis not present

## 2018-03-25 DIAGNOSIS — I10 Essential (primary) hypertension: Secondary | ICD-10-CM | POA: Diagnosis not present

## 2018-03-25 LAB — I-STAT TROPONIN, ED: TROPONIN I, POC: 0.01 ng/mL (ref 0.00–0.08)

## 2018-03-25 NOTE — Discharge Instructions (Addendum)
Please read and follow all provided instructions.  Your diagnoses today include:  1. Left-sided chest pain   2. Alcoholic intoxication without complication (HCC)     Tests performed today include: An EKG of your heart A chest x-ray Cardiac enzymes - a blood test for heart muscle damage Blood counts and electrolytes Vital signs. See below for your results today.   Medications prescribed:   Take any prescribed medications only as directed.  Follow-up instructions: Please follow-up with your primary care provider as soon as you can for further evaluation of your symptoms.   Please follow-up with Dr. Vincente LibertyMolt, your primary care provider within the next week to discuss the ED visit.   Please use the resources given to you for mental health if you would like to reestablish care with a counselor.  Return instructions:  SEEK IMMEDIATE MEDICAL ATTENTION IF: You have severe chest pain, especially if the pain is crushing or pressure-like and spreads to the arms, back, neck, or jaw, or if you have sweating, nausea (feeling sick to your stomach), or shortness of breath. THIS IS AN EMERGENCY. Don't wait to see if the pain will go away. Get medical help at once. Call 911 or 0 (operator). DO NOT drive yourself to the hospital.  Your chest pain gets worse and does not go away with rest.  You have an attack of chest pain lasting longer than usual, despite rest and treatment with the medications your caregiver has prescribed.  You wake from sleep with chest pain or shortness of breath. You feel dizzy or faint. You have chest pain not typical of your usual pain for which you originally saw your caregiver.  You have any other emergent concerns regarding your health.  Additional Information: Chest pain comes from many different causes. Your caregiver has diagnosed you as having chest pain that is not specific for one problem, but does not require admission.  You are at low risk for an acute heart condition  or other serious illness.   Your vital signs today were: BP 120/86    Pulse (!) 112    Resp 18    Ht 5\' 8"  (1.727 m)    Wt 56.7 kg (125 lb)    SpO2 99%    BMI 19.01 kg/m  If your blood pressure (BP) was elevated above 135/85 this visit, please have this repeated by your doctor within one month. --------------

## 2018-03-25 NOTE — ED Provider Notes (Signed)
[facility]   CSN: [id number] Arrival date & time: 03/24/18  2031     History   Chief Complaint Chief Complaint  Patient presents with  . Chest Pain  . Alcohol Intoxication    HPI Bridget Reynolds is a 62 y.o. female.  HPI  Patient is a 62 year old female with history of alcohol use, depression, hyperlipidemia, hypertension, and allergic rhinitis presenting for chest pain and alcohol intoxication.  Patient reports that she began having central chest pain this morning that is sharp and nonradiating.  Patient denies that it began with exertion.  Patient denies radiation to the back, arm, or jaw.  Patient denies diaphoresis, nausea, or shortness of breath with the chest pain.  Patient denies any fevers, chills, recent respiratory illnesses.  Patient denies any coughing.  Patient denies any history of DVT/PE, estrogen use, recent immobilization, hospitalization, surgery, or cancer treatment.  Patient has no cardiac history, denies any family history of early cardiac disease.  Patient is a smoker, 1/2 to 1 pack a day.  Patient notes that she drinks approximately 3 beers today, and this is not usual for her.  Patient reports that she is drinking heavily due to depression.  Patient reports that she discontinued her Celexa, and stopped going to family services of the [facility] recently.  Past Medical History:  Diagnosis Date  . Depression   . Hyperlipidemia   . Hypertension   . Seasonal allergies   . Tobacco use     Patient Active Problem List   Diagnosis Date Noted  . Screening for malignant neoplasm of colon 01/28/2018  . Chronic back pain 12/23/2017  . Severe episode of recurrent major depressive disorder, without psychotic features (HCC) 12/23/2017  . Healthcare maintenance 12/23/2017  . Hypertension 12/23/2017  . Hyperlipidemia 12/08/2014    History reviewed. No pertinent surgical history.   OB History   None      Home  Medications    Prior to Admission medications   Medication Sig Start Date End Date Taking? Authorizing Provider  citalopram (CELEXA) 20 MG tablet Take 1 tablet (20 mg total) by mouth daily. Patient not taking: Reported on 03/13/2018 01/28/18 01/28/19  Molt, Toma CopierBethany, DO    Family History Family History  Problem Relation Age of Onset  . Diabetes Mother   . Heart disease Mother   . Hypertension Mother   . Heart attack Brother        Died at age 61 of MI  . Emphysema Father     Social History Social History   Tobacco Use  . Smoking status: Current Some Day Smoker    Packs/day: 0.50    Years: 5.00    Pack years: 2.50    Types: Cigarettes  . Smokeless tobacco: Never Used  . Tobacco comment: SMOKES ABOUT 6 CIGARETTES A WEEK  Substance Use Topics  . Alcohol use: Yes    Comment: 2 beers per day per patient   . Drug use: No     Allergies   Patient has no known allergies.   Review of Systems Review of Systems  Constitutional: Negative for chills and fever.  HENT: Negative for congestion and rhinorrhea.   Respiratory: Negative for cough and shortness of breath.   Cardiovascular: Positive for chest pain. Negative for palpitations and leg swelling.  Gastrointestinal: Negative for abdominal pain, nausea and vomiting.  Skin: Negative for rash.  Allergic/Immunologic: Negative for immunocompromised state.  Neurological: Negative for syncope and light-headedness.  Psychiatric/Behavioral:       +  Alcohol intoxication  All other systems reviewed and are negative.    Physical Exam Updated Vital Signs BP (!) 123/94   Pulse 75   Resp (!) 21   Ht 5\' 8"  (1.727 m)   Wt 56.7 kg (125 lb)   SpO2 98%   BMI 19.01 kg/m   Physical Exam  Constitutional: She appears well-developed and well-nourished. No distress.  HENT:  Head: Normocephalic and atraumatic.  Mouth/Throat: Oropharynx is clear and moist.  Eyes: Pupils are equal, round, and reactive to light. Conjunctivae and EOM are  normal.  Neck: Normal range of motion. Neck supple.  Cardiovascular: Normal rate, regular rhythm, S1 normal and S2 normal.  No murmur heard. Pulses:      Radial pulses are 2+ on the right side, and 2+ on the left side.       Dorsalis pedis pulses are 2+ on the right side, and 2+ on the left side.       Posterior tibial pulses are 2+ on the right side, and 2+ on the left side.  No lower extremity edema.  Pulmonary/Chest: Effort normal and breath sounds normal. She has no wheezes. She has no rales.  Abdominal: Soft. She exhibits no distension. There is no tenderness. There is no guarding.  Musculoskeletal: Normal range of motion. She exhibits no edema or deformity.  No calf tenderness.  Neurological: She is alert.  Cranial nerves grossly intact. Mild dysarthria with speech, resolved after intoxication improves.  Patient moves extremities symmetrically and with good coordination.  Skin: Skin is warm and dry. No rash noted. No erythema.  Psychiatric: She has a normal mood and affect. Her behavior is normal. Judgment and thought content normal.  Nursing note and vitals reviewed.    ED Treatments / Results  Labs (all labs ordered are listed, but only abnormal results are displayed) Labs Reviewed  BASIC METABOLIC PANEL - Abnormal; Notable for the following components:      Result Value   CO2 20 (*)    All other components within normal limits  ETHANOL - Abnormal; Notable for the following components:   Alcohol, Ethyl (B) 148 (*)    All other components within normal limits  RAPID URINE DRUG SCREEN, HOSP PERFORMED - Abnormal; Notable for the following components:   Tetrahydrocannabinol POSITIVE (*)    All other components within normal limits  URINALYSIS, ROUTINE W REFLEX MICROSCOPIC - Abnormal; Notable for the following components:   Color, Urine COLORLESS (*)    Specific Gravity, Urine 1.002 (*)    Leukocytes, UA TRACE (*)    Bacteria, UA RARE (*)    All other components within  normal limits  CBC  I-STAT TROPONIN, ED  I-STAT BETA HCG BLOOD, ED ([facility], [facility], [facility] ONLY)    EKG None  Radiology Dg Chest 2 View  Result Date: 03/24/2018 CLINICAL DATA:  Chest pain and shortness of breath. EXAM: CHEST - 2 VIEW COMPARISON:  Chest x-ray dated Mar 13, 2018. FINDINGS: The heart size and mediastinal contours are within normal limits. Normal pulmonary vascularity. Mild hyperinflation. No focal consolidation, pleural effusion, or pneumothorax. No acute osseous abnormality. IMPRESSION: No active cardiopulmonary disease. Electronically Signed   By: Obie Dredge M.D.   On: 03/24/2018 22:10   Ct Head Wo Contrast  Result Date: 03/24/2018 CLINICAL DATA:  Head trauma and fall EXAM: CT HEAD WITHOUT CONTRAST CT CERVICAL SPINE WITHOUT CONTRAST TECHNIQUE: Multidetector CT imaging of the head and cervical spine was performed following the standard protocol without intravenous contrast. Multiplanar  CT image reconstructions of the cervical spine were also generated. COMPARISON:  None. FINDINGS: CT HEAD FINDINGS Brain: There is no mass, hemorrhage or extra-axial collection. The size and configuration of the ventricles and extra-axial CSF spaces are normal. There is no acute or chronic infarction. The brain parenchyma is normal. Vascular: No abnormal hyperdensity of the major intracranial arteries or dural venous sinuses. No intracranial atherosclerosis. Skull: The visualized skull base, calvarium and extracranial soft tissues are normal. Sinuses/Orbits: No fluid levels or advanced mucosal thickening of the visualized paranasal sinuses. No mastoid or middle ear effusion. The orbits are normal. CT CERVICAL SPINE FINDINGS Alignment: No static subluxation. Facets are aligned. Occipital condyles are normally positioned. Skull base and vertebrae: No acute fracture. Soft tissues and spinal canal: No prevertebral fluid or swelling. No visible canal hematoma. Disc levels: Mild multilevel degenerative change is  unchanged from the prior study. Upper chest: No pneumothorax, pulmonary nodule or pleural effusion. Other: Normal visualized paraspinal cervical soft tissues. IMPRESSION: No acute abnormality of the head or cervical spine. Electronically Signed   By: Deatra Coachman M.D.   On: 03/24/2018 22:25   Ct Cervical Spine Wo Contrast  Result Date: 03/24/2018 CLINICAL DATA:  Head trauma and fall EXAM: CT HEAD WITHOUT CONTRAST CT CERVICAL SPINE WITHOUT CONTRAST TECHNIQUE: Multidetector CT imaging of the head and cervical spine was performed following the standard protocol without intravenous contrast. Multiplanar CT image reconstructions of the cervical spine were also generated. COMPARISON:  None. FINDINGS: CT HEAD FINDINGS Brain: There is no mass, hemorrhage or extra-axial collection. The size and configuration of the ventricles and extra-axial CSF spaces are normal. There is no acute or chronic infarction. The brain parenchyma is normal. Vascular: No abnormal hyperdensity of the major intracranial arteries or dural venous sinuses. No intracranial atherosclerosis. Skull: The visualized skull base, calvarium and extracranial soft tissues are normal. Sinuses/Orbits: No fluid levels or advanced mucosal thickening of the visualized paranasal sinuses. No mastoid or middle ear effusion. The orbits are normal. CT CERVICAL SPINE FINDINGS Alignment: No static subluxation. Facets are aligned. Occipital condyles are normally positioned. Skull base and vertebrae: No acute fracture. Soft tissues and spinal canal: No prevertebral fluid or swelling. No visible canal hematoma. Disc levels: Mild multilevel degenerative change is unchanged from the prior study. Upper chest: No pneumothorax, pulmonary nodule or pleural effusion. Other: Normal visualized paraspinal cervical soft tissues. IMPRESSION: No acute abnormality of the head or cervical spine. Electronically Signed   By: Deatra Jentz M.D.   On: 03/24/2018 22:25     Procedures Procedures (including critical care time)  Medications Ordered in ED Medications  sodium chloride 0.9 % 1,000 mL with thiamine 100 mg, folic acid 1 mg, multivitamins adult 10 mL infusion ( Intravenous New Bag/Given 03/24/18 2255)  acetaminophen (TYLENOL) tablet 650 mg (650 mg Oral Given 03/24/18 2255)     Initial Impression / Assessment and Plan / ED Course  I have reviewed the triage vital signs and the nursing notes.  Pertinent labs & imaging results that were available during my care of the patient were reviewed by me and considered in my medical decision making (see chart for details).  Clinical Course as of Mar 25 108  Tue Mar 25, 2018  0023 Patient reassessed.  Patient reports she is asymptomatic of chest pain and wanted to go home.  I discussed with patient that we need to get a delta troponin, and then should be able to clear her from a cardiac standpoint.   [AM]  Clinical Course User Index [AM] Elisha Ponder, PA-C   Differential diagnosis includes ACS, PE, thoracic aortic dissection, cardiac tamponade, pneumothorax, incarcerated diaphragmatic hernia, esophageal spasm, gastroesophageal reflux, pericarditis, pneumonia, chest wall pain, costochondritis.   Doubt ACS, as delta troponin is negative, initial and repeat EKGs show no signs of ischemia, infarction, or arrhythmia, and HEART score 2 (age, risk factors). Doubt PE as Well's score 0 and PERC 1 (age),  and patient not tachycardic. Doubt TAD by hx, CXR showed no widening mediastinum, and pulses equal in all extremities. Patient remained nontoxic appearing and in no acute distress during emergency department course. Vital signs stable in the emergency department. Patient has single tachycardic reading.  This was reassessed, and patient had normal pulse.Therefore, doubt esophageal rupture, cardiac tamponade, or pneumothorax.  Patient has had no hematemesis to suggest Mallory Weiss syndrome with alcohol use.  Pericarditis less likely due to no preceding infectious symptoms and pain not improved in upright positions. No abnormal labs.  Encourage primary care follow-up to discuss further cardiac risk factors.  Patient was recently evaluated in a routine follow-up,   While and did not require hyperlipidemia treatment. Patient and family understand and are in agreement with plan of care.  Patient was actively intoxicated with alcohol on presentation emergency department, and was found on the ground.  Could not clear head or cervical spine due to intoxication, but CT of head and neck without acute abnormality.  Patient became tearful when discussing her depression therapy, so social work was contacted to come to discuss options with patient for outpatient therapy.  Resources were dispensed including family services of the [place].   This is a shared visit with Dr. Mancel Bale. Patient was independently evaluated by this attending physician. Attending physician consulted in evaluation and discharge management.  Final Clinical Impressions(s) / ED Diagnoses   Final diagnoses:  Left-sided chest pain  Alcoholic intoxication without complication [facility])    ED Discharge Orders    None       Delia Chimes 03/25/18 0127    Mancel Bale, MD 04/01/18 1214

## 2018-05-26 ENCOUNTER — Encounter (INDEPENDENT_AMBULATORY_CARE_PROVIDER_SITE_OTHER): Payer: Self-pay

## 2018-05-26 ENCOUNTER — Other Ambulatory Visit: Payer: Self-pay

## 2018-05-26 ENCOUNTER — Encounter: Payer: Self-pay | Admitting: Internal Medicine

## 2018-05-26 ENCOUNTER — Ambulatory Visit (INDEPENDENT_AMBULATORY_CARE_PROVIDER_SITE_OTHER): Payer: Medicare HMO | Admitting: Internal Medicine

## 2018-05-26 VITALS — BP 127/77 | HR 82 | Temp 98.3°F | Ht 68.0 in | Wt 119.7 lb

## 2018-05-26 DIAGNOSIS — G8929 Other chronic pain: Secondary | ICD-10-CM

## 2018-05-26 DIAGNOSIS — M5030 Other cervical disc degeneration, unspecified cervical region: Secondary | ICD-10-CM

## 2018-05-26 DIAGNOSIS — Z79899 Other long term (current) drug therapy: Secondary | ICD-10-CM | POA: Diagnosis not present

## 2018-05-26 DIAGNOSIS — M545 Low back pain: Secondary | ICD-10-CM | POA: Diagnosis not present

## 2018-05-26 DIAGNOSIS — R69 Illness, unspecified: Secondary | ICD-10-CM | POA: Diagnosis not present

## 2018-05-26 DIAGNOSIS — M5136 Other intervertebral disc degeneration, lumbar region: Secondary | ICD-10-CM | POA: Diagnosis not present

## 2018-05-26 DIAGNOSIS — I10 Essential (primary) hypertension: Secondary | ICD-10-CM | POA: Diagnosis not present

## 2018-05-26 DIAGNOSIS — F332 Major depressive disorder, recurrent severe without psychotic features: Secondary | ICD-10-CM | POA: Diagnosis not present

## 2018-05-26 DIAGNOSIS — Z791 Long term (current) use of non-steroidal anti-inflammatories (NSAID): Secondary | ICD-10-CM | POA: Diagnosis not present

## 2018-05-26 DIAGNOSIS — M549 Dorsalgia, unspecified: Principal | ICD-10-CM

## 2018-05-26 MED ORDER — MELOXICAM 15 MG PO TABS: 15.0000 mg | ORAL_TABLET | Freq: Every day | ORAL | 2 refills | Status: DC

## 2018-05-26 MED ORDER — DULOXETINE HCL 30 MG PO CPEP: 30.0000 mg | ORAL_CAPSULE | Freq: Every day | ORAL | 1 refills | Status: DC

## 2018-05-26 MED ORDER — NICOTINE 14 MG/24HR TD PT24: 14.0000 mg | MEDICATED_PATCH | TRANSDERMAL | 2 refills | Status: DC

## 2018-05-26 NOTE — Patient Instructions (Signed)
It was great seeing you today!!!!  HAPPY BIRTHDAY!!!  Today we talked about: 1) Your mood: I'm glad you had some benefit from Celexa. I am starting you on a similar medication to Celexa which is actually a chronic pain medication similar to tramadol. It should have effects on both! -Please start taking Cymbalta (Duloxetine) 30mg  daily. You can take this at night to help sleep 2)Back pain: In addition to the above medication which works the same as Tramadol, I am sending in a prescription for Meloxicam which will also help with pain.  I am also referring you to physical therapy as a pain clinic would require this first.   Please return to clinic in about 2 months for follow up!
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
## 2018-05-29 NOTE — Assessment & Plan Note (Signed)
Assessment:  Patient reports improvement in her symptoms on Celexa 20mg  daily however discontinued this >1 month ago as she didn't feel the control was adequate. We discussed that she was actually on a low-dose of this medication and that it is titrated up (mentioned this at our first appointment).     Plan: We discussed going back on Celexa vs starting Cymbalta (considering her chronic back pain). She elects trial of Cymbalta. We'll see her back in 322-months for follow-up.

## 2018-05-29 NOTE — Progress Notes (Signed)
   CC: follow-up of depression, back pain and HTN  HPI:  [name] is a 62 y.o. F with medical history as noted below who presents today to follow-up depression with labile moods, HTN and chronic low back pain who presents today for follow-up. No other complaints.   For details regarding today's visit and the status of their chronic medical issues, please refer to the assessment and plan.  Past Medical History:  Diagnosis Date  . Depression   . Hyperlipidemia   . Hypertension   . Seasonal allergies   . Tobacco use    Review of Systems:   General: Denies fevers, chills, weight loss HEENT: Denies changes in vision, sore throat, cough Cardiac: Denies CP, SOB, palpitations Abd: Denies abdominal pain, changes in bowels Extremities: Denies weakness or swelling  Physical Exam: General: Alert, in no acute distress. Pleasant and conversant HEENT: No icterus, injection or ptosis. No dysarthria. Facial muscles symmetric.  Cardiac: RRR, no MGR appreciated Pulmonary: CTA BL with normal WOB on RA. Able to speak in complete sentences Abd: Soft, non-tender. +bs Extremities: Warm, perfused. No significant pedal edema.  Psych: Somewhat labile mood but pleasant. Denied SI/HI.   Vitals:   05/26/18 1627  BP: 127/77  Pulse: 82  Temp: 98.3 F (36.8 C)  TempSrc: Oral  SpO2: 97%  Weight: 119 lb 11.2 oz (54.3 kg)  Height: 5\' 8"  (1.727 m)   Body mass index is 18.2 kg/m.  Assessment & Plan:   See Encounters Tab for problem based charting.  Patient discussed with Dr. Heide SparkNarendra

## 2018-06-03 ENCOUNTER — Ambulatory Visit
Payer: Medicare HMO | Attending: Student in an Organized Health Care Education/Training Program | Admitting: Physical Therapy

## 2018-06-03 ENCOUNTER — Encounter: Payer: Self-pay | Admitting: Physical Therapy

## 2018-06-03 ENCOUNTER — Other Ambulatory Visit: Payer: Self-pay

## 2018-06-03 DIAGNOSIS — G8929 Other chronic pain: Secondary | ICD-10-CM | POA: Diagnosis not present

## 2018-06-03 DIAGNOSIS — M545 Low back pain: Secondary | ICD-10-CM | POA: Insufficient documentation

## 2018-06-03 DIAGNOSIS — M6281 Muscle weakness (generalized): Secondary | ICD-10-CM | POA: Insufficient documentation

## 2018-06-03 NOTE — Therapy (Addendum)
[place], [place], [address] Phone: [phone]   Fax:  [phone]  Physical Therapy Evaluation/ Discharge   Patient Details  Name: Bridget Reynolds MRN: [id number] Date of Birth: 1956-04-04 Referring Provider: Einar Gip    Encounter Date: 06/03/2018  PT End of Session - 06/03/18 1024    Visit Number  1    Number of Visits  9    Date for PT Re-Evaluation  07/01/18    Authorization Type  [employer] HMO/PPO (PN 10th visit, KX 15th visit)    Authorization Time Period  06/03/18 to 07/04/18    Authorization - Visit Number  1    Authorization - Number of Visits  10    PT Start Time  0945    PT Stop Time  1023    PT Time Calculation (min)  38 min    Activity Tolerance  Patient tolerated treatment well    Behavior During Therapy  [facility] for tasks assessed/performed       Past Medical History:  Diagnosis Date  . Depression   . Hyperlipidemia   . Hypertension   . Seasonal allergies   . Tobacco use     History reviewed. No pertinent surgical history.  There were no vitals filed for this visit.   Subjective Assessment - 06/03/18 0944    Subjective  I injured my back falling about 2 years ago falling on cement floor; my back has been sore and achey since, I did not have any back issues before. I have numbness and tingling going down both legs down to the knee. Nothing seems to make my back worse, nothing makes it feel better. No bowel/bladder incontinence. No other falls. The pain does keep me awake at night.     How long can you sit comfortably?  15 minutes    How long can you stand comfortably?  10 minutes    How long can you walk comfortably?  "I don't walk too far" unable to clarify distance     Patient Stated Goals  feel better    Currently in Pain?  Yes    Pain Score  5     Pain Location  Back    Pain Orientation  Right;Left;Lower    Pain Descriptors / Indicators  Aching;Sore    Pain Type   Chronic pain    Pain Radiating Towards  down B LEs to knees     Pain Onset  More than a month ago    Pain Frequency  Constant    Aggravating Factors   unsure    Pain Relieving Factors  unsure    Effect of Pain on Daily Activities  moderate         [facility] PT Assessment - 06/03/18 0001      Assessment   Medical Diagnosis  chronic LBP     Referring Provider  Bethany Molt     Onset Date/Surgical Date  --   chronic    Next MD Visit  Dr. Danford Bad in 2 months    Prior Therapy  2 years ago PT for back       Precautions   Precautions  None      Restrictions   Weight Bearing Restrictions  No      Balance Screen   Has the patient fallen in the past 6 months  No    Has the patient had a decrease in activity level because of a fear of falling?  No    Is the patient reluctant to leave their home because of a fear of falling?   No      Prior Function   Level of Independence  Independent;Independent with basic ADLs;Independent with gait;Independent with transfers    Vocation  On disability    Leisure  no hobbies, stays home       Observation/Other Assessments   Observations  SLR test (+), scour (+) B, FABER (+) B       AROM   Overall AROM Comments  B hip IR/ER limited    Lumbar Flexion  severe limitation, painful     Lumbar Extension  severe limitation, painful     Lumbar - Right Side Bend  severe limitation, painful     Lumbar - Left Side Bend  severe limitation, painful       Strength   Right Hip Flexion  3+/5    Right Hip Extension  2/5    Right Hip ABduction  3+/5    Left Hip Flexion  3+/5    Left Hip Extension  2/5    Left Hip ABduction  4-/5    Right Knee Flexion  3/5    Right Knee Extension  3/5    Left Knee Flexion  3/5    Left Knee Extension  3/5      Flexibility   Soft Tissue Assessment /Muscle Length  yes    Hamstrings  mild limitation B     Quadriceps  severe limitation B     Piriformis  mild limitation B       Palpation   Spinal mobility  severely hypomobile     Palpation comment  TTP bilateral lumbar paraspinals, R glutes, B hamstrings                 Objective measurements completed on examination: See above findings.      [facility] Adult PT Treatment/Exercise - 06/03/18 0001      Exercises   Exercises  Lumbar      Lumbar Exercises: Stretches   Single Knee to Chest Stretch  5 reps;10 seconds    Lower Trunk Rotation  5 reps;10 seconds      Lumbar Exercises: Supine   Clam  10 reps    Bridge  10 reps             PT Education - 06/03/18 1024    Education Details  exam findings, prognosis, POC, HEP, importance of regular physical activity in modulating back pain, consider walking program or aquatic exercise program especially with supportive friends     Person(s) Educated  Patient    Methods  Explanation;Verbal cues;Handout    Comprehension  Verbalized understanding;Returned demonstration       PT Short Term Goals - 06/03/18 1027      PT SHORT TERM GOAL #1   Title  Patient to be compliant with appropriate HEP, to be updated PRN     Time  1    Period  Weeks    Status  New    Target Date  06/10/18      PT SHORT TERM GOAL #2   Title  Patient to have initiated regular walking or water exercise program at least 1-2 times weekly to increase activity at home     Time  1    Period  Weeks    Status  New        PT Long Term Goals - 06/03/18 1028  PT LONG TERM GOAL #1   Title  Patient to demonstrate improvement of at least 1 MMT grade in all tested groups in order to reduce pain and show improved functional strength     Time  4    Period  Weeks    Status  New    Target Date  07/01/18      PT LONG TERM GOAL #2   Title  Patient to demonstrate lumbar ROM as being limited by no more than 20% on all planes in order to show improved mobility and reduce pain     Time  4    Period  Weeks    Status  New      PT LONG TERM GOAL #3   Title  Patient to be able to sit/stand/walk for at least 30 minutes at a time  without increase in pain to improve QOL and tolerance to activity     Time  4    Period  Weeks    Status  New      PT LONG TERM GOAL #4   Title  Patient to report pain as being no more than 3/10 at worst in order to improve QOL and tolerance to activity     Time  4    Period  Weeks    Status  New             Plan - 06/03/18 1025    Clinical Impression Statement  Patient arrives with reports of chronic back pain approximately 2 years in duration; she reports nothing makes it better or worse and that it is always present, she does not do a lot and has really just stayed around her house after her husband died. Examination reveals severe lumbar stiffness, postural impairments, significant TTP lumbar and bilateral hip regions, limited bilateral hip IR/ER ROM, reduced functional strength, and reduced core strength. Of note, patient with flat affect and reports she does not eat or get out of the house much as "I am just not hungry or interested"; suspect possible psycho-social influence on pain as well. She may benefit from a trial of skilled PT services in order to address functional deficits, will also plan to discuss possible referral to neuro-psychologist with referring MD.     Clinical Presentation  Stable    Clinical Decision Making  Low    Rehab Potential  Good    PT Frequency  2x / week    PT Duration  4 weeks    PT Treatment/Interventions  ADLs/Self Care Home Management;Cryotherapy;Electrical Stimulation;Iontophoresis 42m/ml Dexamethasone;Moist Heat;Ultrasound;DME Instruction;Gait training;Stair training;Functional mobility training;Therapeutic activities;Therapeutic exercise;Balance training;Neuromuscular re-education;Patient/family education;Manual techniques;Passive range of motion;Dry needling;Taping    PT Next Visit Plan  review HEP and goals; focus on lumbar/hip mobility, gross strengthening program, conditioning progrma, promote increased activity at home     PT Home Exercise  Plan  Eval: bridges, supine clams with yellow TB, SKTC, lumbar rotations    Consulted and Agree with Plan of Care  Patient       Patient will benefit from skilled therapeutic intervention in order to improve the following deficits and impairments:  Increased fascial restricitons, Improper body mechanics, Pain, Decreased coordination, Decreased mobility, Postural dysfunction, Decreased activity tolerance, Decreased range of motion, Decreased strength, Hypomobility, Difficulty walking, Impaired flexibility  Visit Diagnosis: Chronic bilateral low back pain, with sciatica presence unspecified - Plan: PT plan of care cert/re-cert  Muscle weakness (generalized) - Plan: PT plan of care cert/re-cert   PHYSICAL THERAPY DISCHARGE SUMMARY  Visits from Start of Care: 1  Current functional level related to goals / functional outcomes: Unknown patient did not return for follow up   Remaining deficits: Unknown    Education / Equipment: HEP Plan: Patient agrees to discharge.  Patient goals were not met. Patient is being discharged due to not returning since the last visit.  ?????     Problem List Patient Active Problem List   Diagnosis Date Noted  . Screening for malignant neoplasm of colon 01/28/2018  . Chronic back pain 12/23/2017  . Severe episode of recurrent major depressive disorder, without psychotic features ([place]) 12/23/2017  . Healthcare maintenance 12/23/2017  . Hypertension 12/23/2017  . Hyperlipidemia 12/08/2014    Deniece Ree PT, DPT, [place]  Supplemental Physical Therapist [place]   Pager [facility] [address] [place], [place], [address] Phone: [phone]   Fax:  [phone]  Name: Marveline Profeta MRN: [id number] Date of Birth: [birth year]

## 2018-06-03 NOTE — Patient Instructions (Signed)
  SINGLE KNEE TO CHEST STRETCH - SKTC  While lying on your back, use your hands and gently draw up a knee towards your chest.   Keep your other knee straight and lying on the ground.  Hold for a slow count of 10, then relax.  Repeat 5 times each side, twice a day.     Lumbar Rotations   Lying on your back with your knees bent, slowly drop your legs to one side and hold the stretch. Come back to the middle and switch sides. You should feel the stretch in your back on the opposite side that your legs are leaning.  Hold for a slow count of 10 seconds, then relax and switch sides.  Repeat 5 times each side, twice a day.     BRIDGING  While lying on your back with knees bent, tighten your lower abdominals, squeeze your buttocks and then raise your buttocks off the floor/bed as creating a "Bridge" with your body.  Repeat 10 times, twice a day.     SUPINE HIP ABDUCTION - ELASTIC BAND CLAMS - [place]  Lie down on your back with your knees bent. Place an elastic band around your knees and then pull your knees apart.  Repeat 10 times, twice a day.

## 2018-06-04 NOTE — Progress Notes (Signed)
Internal Medicine Clinic Attending  Case discussed with Dr. Molt at the time of the visit.  We reviewed the resident's history and exam and pertinent patient test results.  I agree with the assessment, diagnosis, and plan of care documented in the resident's note. 

## 2018-06-10 ENCOUNTER — Telehealth: Payer: Self-pay | Admitting: Internal Medicine

## 2018-06-10 NOTE — Telephone Encounter (Signed)
PT WENT TO REHAB, BUT IT IS 40.00 COPAY PER VISIT SHE CAN NOT AFFORD THIS, CAN SHE GO SOMEWHERE ELSE FOR LESS?

## 2018-06-18 ENCOUNTER — Ambulatory Visit: Payer: Medicare HMO | Admitting: Physical Therapy

## 2018-06-18 NOTE — Telephone Encounter (Signed)
Called the pt and [facility] and discuss her concerns that she could not afford 2 appts a week and what where her options.  Pt Verbally agreed to call their office and discuss her options and thanked our office for following up.

## 2018-06-19 ENCOUNTER — Telehealth: Payer: Self-pay | Admitting: Physical Therapy

## 2018-06-19 NOTE — Telephone Encounter (Signed)
Called and LVM requesting pt to call back.  [facility] And notify her that she missed her appt with us yesterday.

## 2018-06-20 ENCOUNTER — Ambulatory Visit: Payer: Medicare HMO | Admitting: Physical Therapy

## 2018-06-24 ENCOUNTER — Ambulatory Visit: Payer: Medicare HMO | Admitting: Physical Therapy

## 2018-06-24 NOTE — Telephone Encounter (Signed)
Thanks

## 2018-06-26 ENCOUNTER — Ambulatory Visit
Payer: Medicare HMO | Attending: Student in an Organized Health Care Education/Training Program | Admitting: Physical Therapy

## 2018-06-26 ENCOUNTER — Telehealth: Payer: Self-pay | Admitting: Physical Therapy

## 2018-06-26 NOTE — Telephone Encounter (Signed)
Called patient regarding her missed appt this AM.  Left message for her to call our clinic to discuss her PT plan of care.  Reminded her of the cancellation/no -show policy.  Has not returned since eval. Karie Mainland, PT 06/26/18 10:04 AM Phone: [phone] Fax: [phone]

## 2018-07-01 ENCOUNTER — Ambulatory Visit: Payer: Medicare HMO | Admitting: Physical Therapy

## 2018-07-03 ENCOUNTER — Telehealth: Payer: Self-pay | Admitting: Physical Therapy

## 2018-07-03 ENCOUNTER — Ambulatory Visit: Payer: Medicare HMO | Admitting: Physical Therapy

## 2018-07-03 NOTE — Telephone Encounter (Signed)
Left message on machine about missed visits.  She is being discharged due to attendance policy.  She was informed that when the time is right , we look forward to helping her.  Our phone number was given for her to call if there were any questions. Liz BeachKaren Lyal Husted PTA

## 2018-07-08 ENCOUNTER — Ambulatory Visit: Payer: Medicare HMO | Admitting: Physical Therapy

## 2018-07-10 ENCOUNTER — Encounter: Payer: Self-pay | Admitting: Physical Therapy

## 2018-09-25 DIAGNOSIS — R69 Illness, unspecified: Secondary | ICD-10-CM | POA: Diagnosis not present

## 2018-11-17 ENCOUNTER — Encounter (HOSPITAL_COMMUNITY): Payer: Self-pay

## 2018-11-17 ENCOUNTER — Emergency Department (HOSPITAL_COMMUNITY)
Admission: EM | Admit: 2018-11-17 | Discharge: 2018-11-18 | Disposition: A | Discharge status: Home / Self Care | Payer: Medicare Other | Attending: Emergency Medicine | Admitting: Emergency Medicine

## 2018-11-17 DIAGNOSIS — Z5321 Procedure and treatment not carried out due to patient leaving prior to being seen by health care provider: Secondary | ICD-10-CM | POA: Insufficient documentation

## 2018-11-17 DIAGNOSIS — R079 Chest pain, unspecified: Secondary | ICD-10-CM | POA: Insufficient documentation

## 2018-11-17 NOTE — ED Notes (Signed)
Registration called pt x4, no response.

## 2018-11-17 NOTE — ED Notes (Signed)
Called pt x2 for vitals, no response. °

## 2018-11-17 NOTE — ED Triage Notes (Signed)
Per [facility], pt form home with complaint of Chest pain x 3 days, worse with palpation and movement. No associated sx other than anxiety.

## 2018-11-17 NOTE — ED Notes (Signed)
Called pt to see if I can get blood no answer

## 2018-11-17 NOTE — ED Notes (Signed)
Called pt x2, no response. 

## 2018-11-25 ENCOUNTER — Ambulatory Visit: Payer: Medicare Other

## 2018-11-27 ENCOUNTER — Ambulatory Visit: Payer: Medicare Other

## 2018-12-02 ENCOUNTER — Ambulatory Visit: Payer: Medicare Other

## 2018-12-03 ENCOUNTER — Ambulatory Visit (INDEPENDENT_AMBULATORY_CARE_PROVIDER_SITE_OTHER): Payer: Medicare Other | Admitting: Internal Medicine

## 2018-12-03 ENCOUNTER — Other Ambulatory Visit: Payer: Self-pay

## 2018-12-03 VITALS — BP 143/74 | HR 98 | Temp 99.4°F | Ht 68.0 in | Wt 121.0 lb

## 2018-12-03 DIAGNOSIS — G4700 Insomnia, unspecified: Secondary | ICD-10-CM

## 2018-12-03 DIAGNOSIS — F419 Anxiety disorder, unspecified: Secondary | ICD-10-CM | POA: Insufficient documentation

## 2018-12-03 DIAGNOSIS — Z79899 Other long term (current) drug therapy: Secondary | ICD-10-CM

## 2018-12-03 DIAGNOSIS — F332 Major depressive disorder, recurrent severe without psychotic features: Secondary | ICD-10-CM

## 2018-12-03 DIAGNOSIS — Z681 Body mass index (BMI) 19 or less, adult: Secondary | ICD-10-CM

## 2018-12-03 DIAGNOSIS — R634 Abnormal weight loss: Secondary | ICD-10-CM

## 2018-12-03 MED ORDER — LORAZEPAM 0.5 MG PO TABS: 0.5000 mg | ORAL_TABLET | Freq: Every day | ORAL | 0 refills | Status: DC | PRN

## 2018-12-03 MED ORDER — SERTRALINE HCL 25 MG PO TABS: 25.0000 mg | ORAL_TABLET | Freq: Every day | ORAL | 0 refills | Status: DC

## 2018-12-03 NOTE — Progress Notes (Signed)
12  

## 2018-12-03 NOTE — Patient Instructions (Signed)
Ms. Lofthouse,   For your depression and anxiety we have started a medication called Zoloft 25 mg.  You will take 1 tablet of this daily.  This will take several weeks to start taking effect in your body.  In the meantime I have prescribed a short course of an anxiolytic called Ativan for when you have anxiety attacks.  Please only take this as needed as this medication will not be refilled.  Make a follow-up appointment with [place] in 4 weeks to make sure that the medications are working.  If they are not we will need to adjust dose.  Call us if you have any questions or concerns.  -Dr. Evelene Croon

## 2018-12-04 ENCOUNTER — Encounter: Payer: Self-pay | Admitting: Internal Medicine

## 2018-12-04 NOTE — Assessment & Plan Note (Addendum)
Patient presents for follow up of anxiety. She describes feeling worried about everything all the time which does not let her sleep at night.  She states she feels angry all the time and lashes out at people most of the day and most of the time without a reason. Her neighbors no longer speak to her due to this mood swings.  She is concerned she is bipolar. In addition, she reports episodes of chest pain, palpitations, nausea, and feelings of impending doom. GAD7 = 20.  - Start Zoloft 25 mg QD  - Ativan 0.5 mg QD PRN #10 tablets no RF. Patient educated on how to properly use this and advised it may not be refilled  - Referral to behavioral health

## 2018-12-04 NOTE — Progress Notes (Signed)
   CC: Anxiety and depression    HPI:  [name] is a 63 y.o. year-old female with PMH listed below who presents to clinic for anxiety and depression. Please see problem based assessment and plan for further details.   Past Medical History:  Diagnosis Date  . Depression   . Hyperlipidemia   . Hypertension   . Seasonal allergies   . Tobacco use    Review of Systems:   Review of Systems  Constitutional: Positive for malaise/fatigue and weight loss. Negative for chills and fever.  Psychiatric/Behavioral: Positive for depression. Negative for hallucinations, memory loss, substance abuse and suicidal ideas. The patient is nervous/anxious and has insomnia.     Physical Exam: Vitals:   12/03/18 1347  BP: (!) 143/74  Pulse: 98  Temp: 99.4 F (37.4 C)  TempSrc: Oral  SpO2: 96%  Weight: 121 lb (54.9 kg)  Height: 5\' 8"  (1.727 m)    General: thin female, tearful, in no acute distress  Psych: Patient describes mood as terrible. Affect is appropriate. Appears attentive and has appropriate behavior during our encounter. Speech is normal. Concentration is appropriate. Denies VH, AH, SI, Hi. Judgement and insight appeared fair.         Office Visit from 12/03/2018 in [facility]  PHQ-9 Total Score  23     GAD -7 = 20  Assessment & Plan:   See Encounters Tab for problem based charting.  Patient discussed with Dr. Josem Kaufmann

## 2018-12-04 NOTE — Progress Notes (Signed)
Case discussed with Dr. Lovenia KimSantos-Sanchez at the time of the visit. We reviewed the resident's history and exam and pertinent patient test results. I agree with the assessment, diagnosis, and plan of care documented in the resident's note.  [name] has both depression and anxiety and sertraline should address both conditions.  We will titrate the medication to effect over the next several weeks and the patient was asked to give it some time to work.  In the interim, she was given #10 tablets of ativan to address any panic attacks until the sertraline begins to "kick-in".

## 2018-12-04 NOTE — Assessment & Plan Note (Addendum)
Ms. Migues presents for follow up of depression. States she has been depressed since her husband died 3 years ago and things in her life have been difficult since then. She has difficulty falling asleep and concentrating on things she needs to do. She has lost her appetite and has not been eating much for the last 2 months. She feels guilty about not being able to help her her husband. Denies previous suicidal attempts and suicidal/homicidal ideation. Does not own a gun or has guns in her house.  She has been on Celexa dn Cymbalta but these have not helped. She stopped taking Cymbalta 6 months ago. She has never had counseling. PHQ-9=23.  - Discontinue Cymbalta  - Start Zoloft 25 mg QD  - Referral to behavioral health with Ms. Hicks  - Follow up in 4 weeks

## 2018-12-09 ENCOUNTER — Encounter: Payer: Self-pay | Admitting: Licensed Clinical Social Worker

## 2018-12-09 ENCOUNTER — Telehealth: Payer: Self-pay | Admitting: Licensed Clinical Social Worker

## 2018-12-09 NOTE — Telephone Encounter (Signed)
Patient was contacted due to referral to [facility] by her PCP. Patient could not be reached and no voicemail system was available. Behavioral health intern will send a letter to contact the patient regarding IBH services.   -Ortencia Kick, Behavioral Health Intern

## 2018-12-19 ENCOUNTER — Ambulatory Visit: Payer: Medicare Other | Admitting: Family Medicine

## 2018-12-30 IMAGING — DX DG PELVIS 1-2V
1 series · 1 of 1 positions shown · non-contrast
Comparison: Lumbar spine radiographs from 11/30/2013.

CLINICAL DATA: Patient fell and has pelvic pain on the left.

EXAM:
PELVIS - 1-2 VIEW

[t pelvis ap]
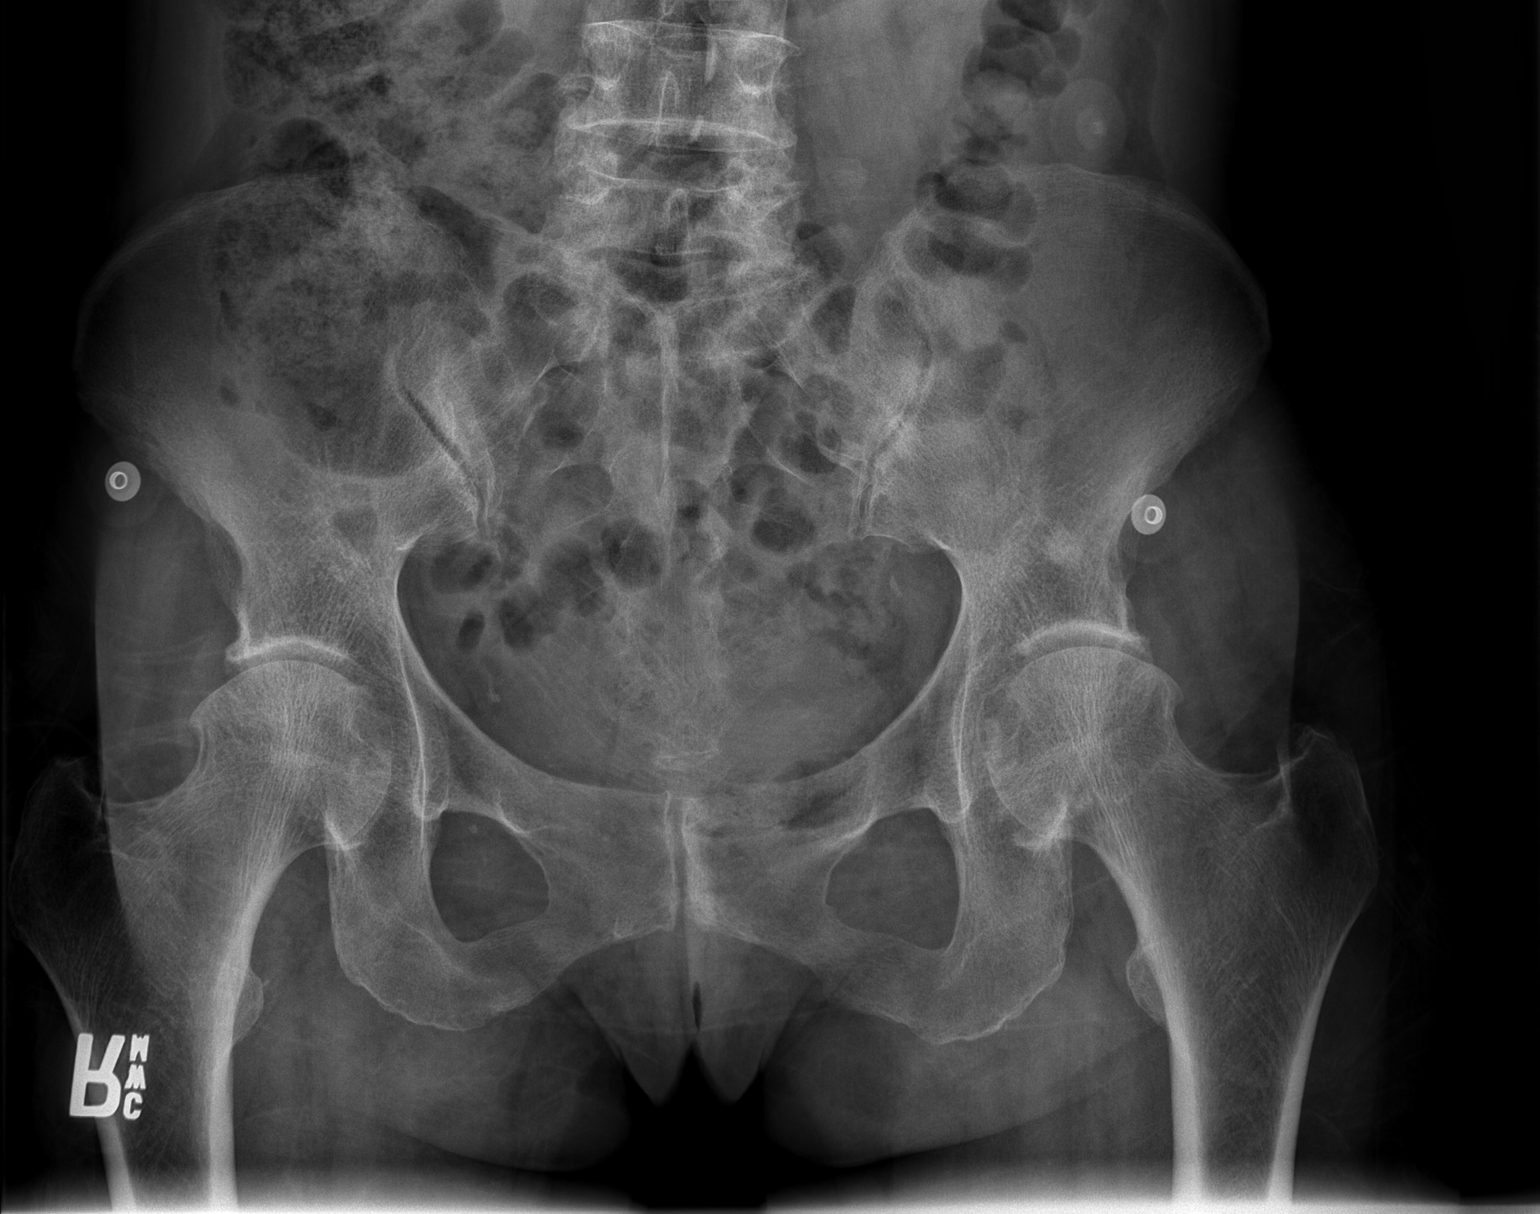

[1 of 1 positions shown; findings below may reference images not displayed]

FINDINGS: Lower lumbar facet arthropathy and degenerative disc disease. Stable
sclerotic density in the left iliac bone likely representing a bone
island. Osteoarthritis of both SI joints with sclerosis and
spurring. Degenerative change about the pubic symphysis also noted
with sclerosis. No acute pelvic fracture is identified. The pubic
rami appear intact. No proximal femoral fracture is seen.Spurring at
the femoral head-neck juncture is noted of both femora. Maintained
hip joints without significant joint space narrowing.
IMPRESSION: No acute osseous abnormality of the pelvis and either hip. Lower
lumbar facet arthropathy. Osteoarthritis of the SI joints and both
hips.

## 2019-01-06 NOTE — Addendum Note (Signed)
Addended by: Neomia Dear on: 01/06/2019 08:03 AM   Modules accepted: Orders

## 2019-01-10 IMAGING — CT CT CERVICAL SPINE W/O CM
5 of 8 series · 14 of 33 positions shown, 15 images · non-contrast
Comparison: None.

CLINICAL DATA: Head trauma and fall

EXAM:
CT HEAD WITHOUT CONTRAST
CT CERVICAL SPINE WITHOUT CONTRAST
TECHNIQUE: Multidetector CT imaging of the head and cervical spine was
performed following the standard protocol without intravenous
contrast. Multiplanar CT image reconstructions of the cervical spine
were also generated.

[Series 4: head bone · axial · 0.41mm/px · z∈[-114,-62]mm · 2 of 80 slices shown]
[im 27/80  bone]
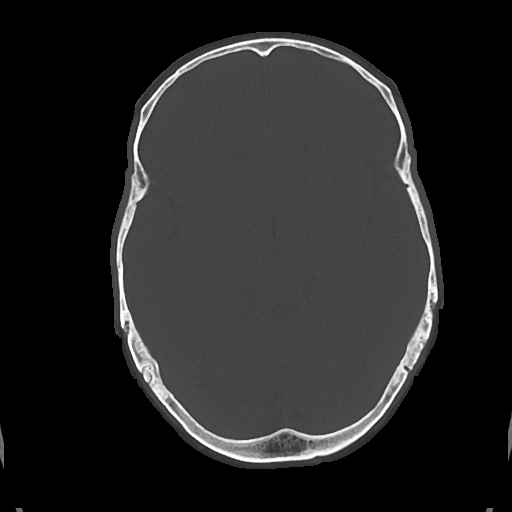
[im 53/80  bone]
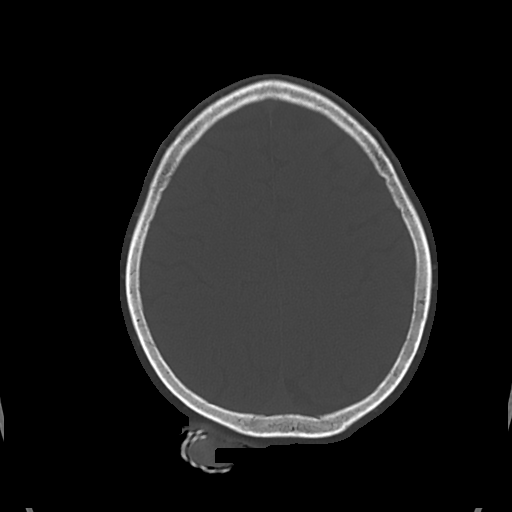

[Series 5: cor soft · coronal · 0.31mm/px · 3 of 66 slices shown]
[im 17/66  bone]
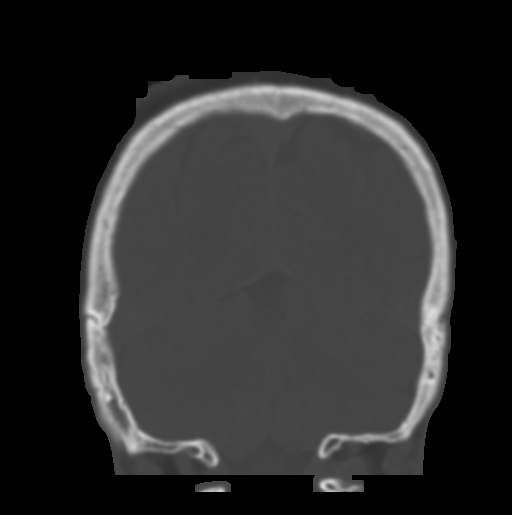
[im 33/66  bone]
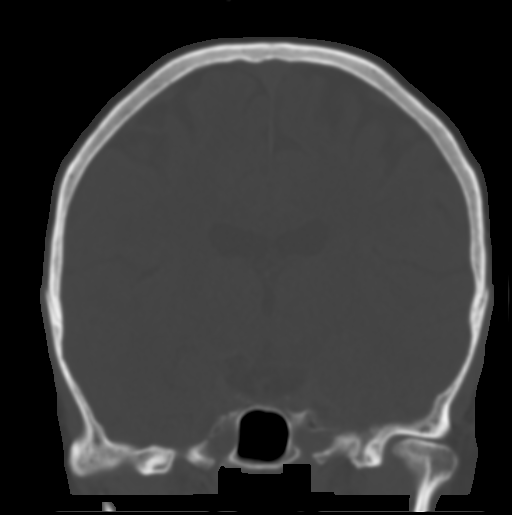
[im 49/66  bone]
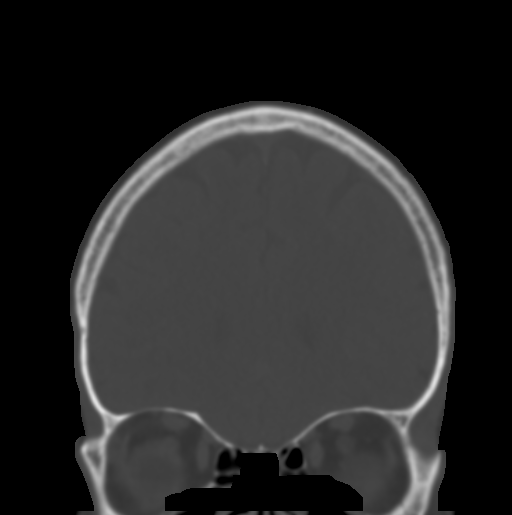

[Series 8: c spine soft · axial · 0.25mm/px · z∈[-225,-175]mm · 2 of 77 slices shown]
[im 26/77  soft-tissue]
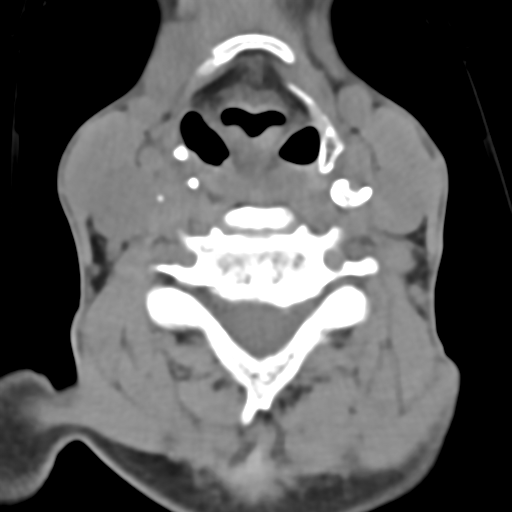
[im 51/77  soft-tissue]
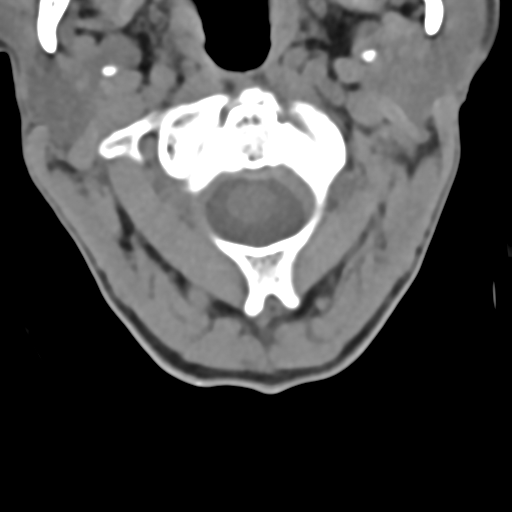

[Series 9: sag bone · sagittal · 0.31mm/px · 5 of 61 slices shown]
[im 11/61  bone]
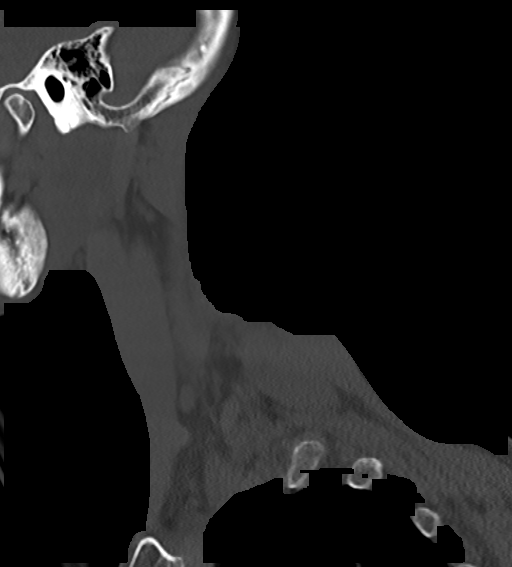
[im 21/61  bone]
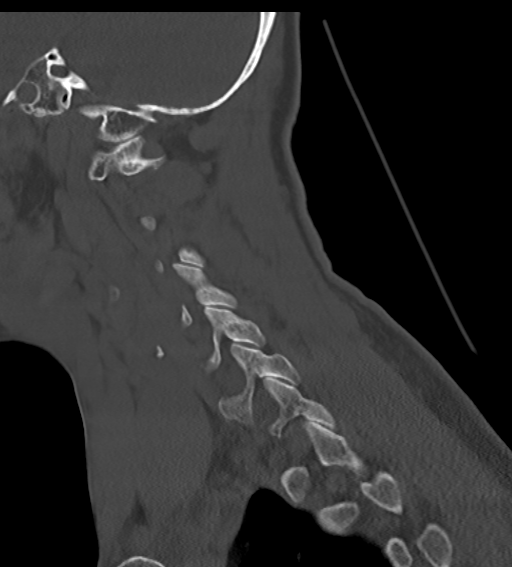
[im 31/61  bone]
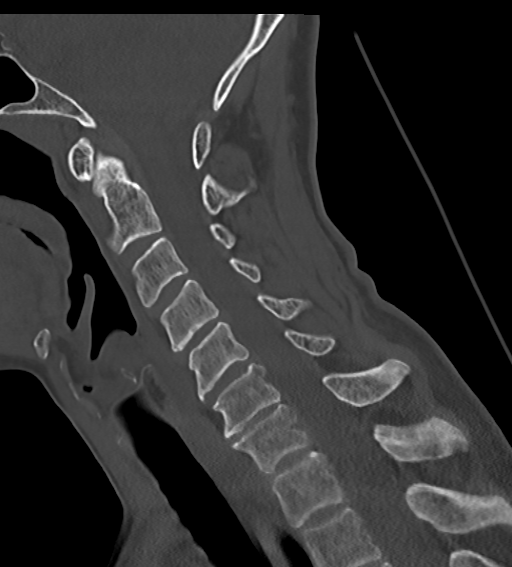
[im 41/61  bone]
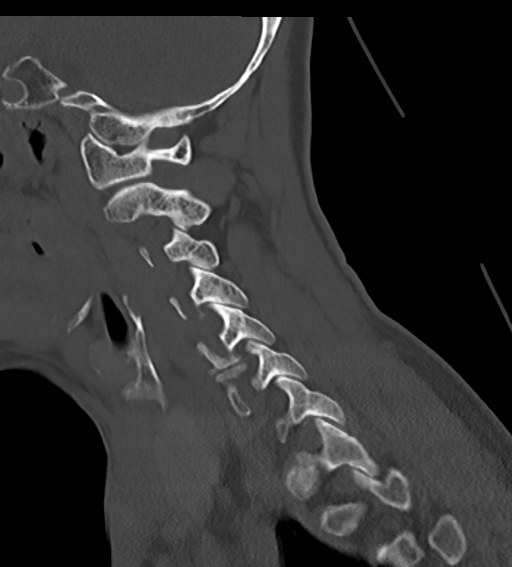
[im 51/61  bone]
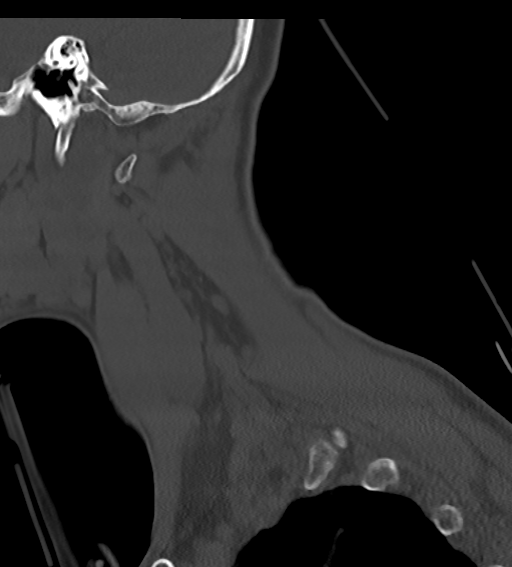

[Series 12: orthogonal axials · axial · 0.21mm/px · z∈[-252,-208]mm · 2 of 78 slices shown, 3 images]
[im 26/78  soft-tissue]
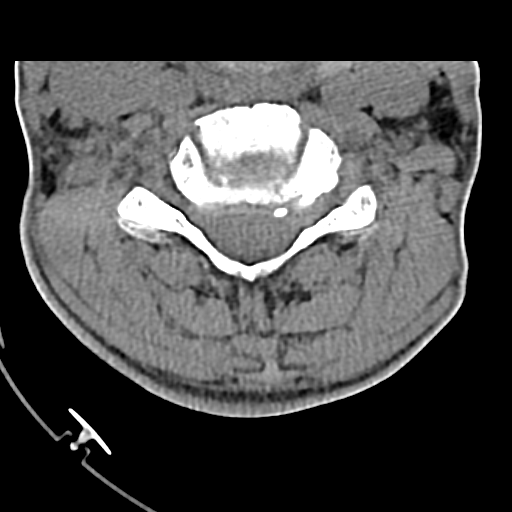
[im 26/78  bone]
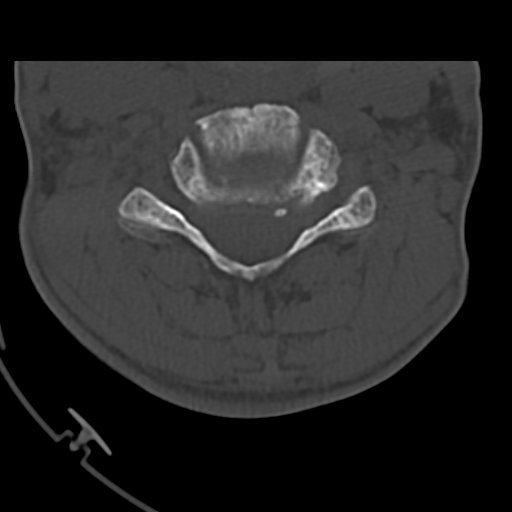
[im 52/78  bone]
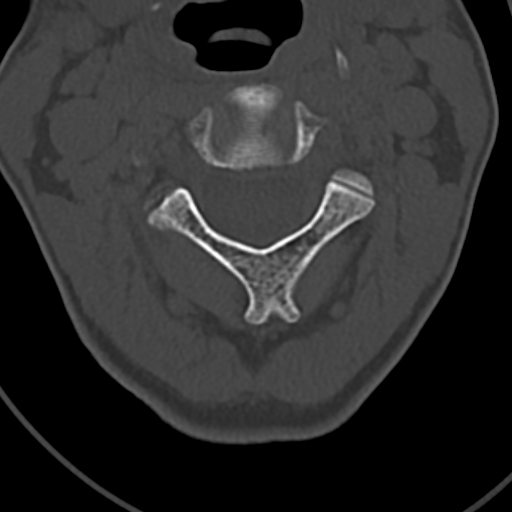

[14 of 33 positions shown; findings below may reference images not displayed]

FINDINGS: CT HEAD FINDINGS

Brain: There is no mass, hemorrhage or extra-axial collection. The
size and configuration of the ventricles and extra-axial CSF spaces
are normal. There is no acute or chronic infarction. The brain
parenchyma is normal.

Vascular: No abnormal hyperdensity of the major intracranial
arteries or dural venous sinuses. No intracranial atherosclerosis.

Skull: The visualized skull base, calvarium and extracranial soft
tissues are normal.

Sinuses/Orbits: No fluid levels or advanced mucosal thickening of
the visualized paranasal sinuses. No mastoid or middle ear effusion.
The orbits are normal.

CT CERVICAL SPINE FINDINGS

Alignment: No static subluxation. Facets are aligned. Occipital
condyles are normally positioned.

Skull base and vertebrae: No acute fracture.

Soft tissues and spinal canal: No prevertebral fluid or swelling. No
visible canal hematoma.

Disc levels: Mild multilevel degenerative change is unchanged from
the prior study.

Upper chest: No pneumothorax, pulmonary nodule or pleural effusion.

Other: Normal visualized paraspinal cervical soft tissues.
IMPRESSION: No acute abnormality of the head or cervical spine.

## 2019-02-02 ENCOUNTER — Ambulatory Visit: Payer: Medicare Other | Admitting: Nurse Practitioner

## 2019-02-11 ENCOUNTER — Other Ambulatory Visit: Payer: Self-pay

## 2019-02-11 ENCOUNTER — Other Ambulatory Visit: Payer: Self-pay | Admitting: Internal Medicine

## 2019-02-11 NOTE — Telephone Encounter (Signed)
LORazepam (ATIVAN) 0.5 MG tablet, refill request [address] - [place], [place] - [address] [id number] (Phone) [phone] (Fax)

## 2019-04-03 ENCOUNTER — Other Ambulatory Visit: Payer: Self-pay | Admitting: Internal Medicine

## 2019-04-03 NOTE — Telephone Encounter (Signed)
Refill Request    nicotine (NICODERM CQ - DOSED IN MG/24 HOURS) 14 mg/24hr patch    sertraline (ZOLOFT) 25 MG tablet

## 2019-04-04 MED ORDER — SERTRALINE HCL 25 MG PO TABS: 25.0000 mg | ORAL_TABLET | Freq: Every day | ORAL | 0 refills | Status: DC

## 2019-04-04 MED ORDER — NICOTINE 14 MG/24HR TD PT24: 14.0000 mg | MEDICATED_PATCH | TRANSDERMAL | 2 refills | Status: AC

## 2019-04-20 ENCOUNTER — Encounter: Payer: Self-pay | Admitting: Internal Medicine

## 2019-04-20 ENCOUNTER — Ambulatory Visit (INDEPENDENT_AMBULATORY_CARE_PROVIDER_SITE_OTHER): Payer: Medicare Other | Admitting: Internal Medicine

## 2019-04-20 ENCOUNTER — Other Ambulatory Visit: Payer: Self-pay

## 2019-04-20 VITALS — BP 135/88 | HR 102 | Temp 99.2°F | Ht 68.0 in | Wt 122.5 lb

## 2019-04-20 DIAGNOSIS — F17211 Nicotine dependence, cigarettes, in remission: Secondary | ICD-10-CM

## 2019-04-20 DIAGNOSIS — Z79899 Other long term (current) drug therapy: Secondary | ICD-10-CM | POA: Diagnosis not present

## 2019-04-20 DIAGNOSIS — F419 Anxiety disorder, unspecified: Secondary | ICD-10-CM | POA: Diagnosis not present

## 2019-04-20 DIAGNOSIS — I10 Essential (primary) hypertension: Secondary | ICD-10-CM

## 2019-04-20 DIAGNOSIS — Z0000 Encounter for general adult medical examination without abnormal findings: Secondary | ICD-10-CM

## 2019-04-20 MED ORDER — TETANUS-DIPHTH-ACELL PERTUSSIS 5-2.5-18.5 LF-MCG/0.5 IM SUSP: 0.5000 mL | Freq: Once | INTRAMUSCULAR | 0 refills | Status: AC

## 2019-04-20 MED ORDER — SERTRALINE HCL 25 MG PO TABS: 25.0000 mg | ORAL_TABLET | Freq: Every day | ORAL | 1 refills | Status: DC

## 2019-04-20 NOTE — Patient Instructions (Addendum)
Ms. Tereasa Yilmaz,  It was a pleasure to see you today. Thank you for coming in.   Today we discussed your anxiety. In regards to this please restart taking the zoloft 25 mg daily, please follow up in 1 month to see how you are doing. I will let the behavioral health specialist know that you have a new number.    We also discussed getting a colonoscopy and mammogram. I have placed these referrals in.  I have given you a prescription for Tdap, please take this to your local pharmacy and they will be able to complete it for you.   Please return to clinic in 1 month or sooner if needed.   Thank you again for coming in.   Asencion Noble.D.

## 2019-04-20 NOTE — Progress Notes (Signed)
CC: Anxiety, depression, and HTN  HPI:  Bridget Reynolds is a 63 y.o.  with a PMH listed below presenting for anxiety, depression, HTN   Please see A&P for status of the patient's chronic medical conditions  Past Medical History:  Diagnosis Date  . Depression   . Hyperlipidemia   . Hypertension   . Seasonal allergies   . Tobacco use    Review of Systems: Refer to history of present illness and assessment and plans for pertinent review of systems, all others reviewed and negative.  Physical Exam:  Vitals:   04/20/19 1606  BP: 135/88  Pulse: (!) 102  Temp: 99.2 F (37.3 C)  TempSrc: Oral  SpO2: 97%  Weight: 122 lb 8 oz (55.6 kg)  Height: 5\' 8"  (1.727 m)    Physical Exam  Constitutional: She is oriented to person, place, and time.  Anxious appearing, NAD, sitting in chair  Eyes: Pupils are equal, round, and reactive to light. Conjunctivae and EOM are normal.  Cardiovascular: Regular rhythm and normal heart sounds.  Tcahycardic  Pulmonary/Chest: Effort normal and breath sounds normal. No respiratory distress.  Abdominal: Soft. Bowel sounds are normal.  Musculoskeletal: Normal range of motion.        General: No edema.  Neurological: She is alert and oriented to person, place, and time.  Skin: Skin is warm and dry.  Psychiatric:  Anxious appearing    Social History   Socioeconomic History  . Marital status: Widowed    Spouse name: Not on file  . Number of children: 0  . Years of education: Not on file  . Highest education level: Not on file  Occupational History  . Occupation: Works in a Control and instrumentation engineer Needs  . Financial resource strain: Not on file  . Food insecurity    Worry: Not on file    Inability: Not on file  . Transportation needs    Medical: Not on file    Non-medical: Not on file  Tobacco Use  . Smoking status: Former Smoker    Packs/day: 0.50    Years: 5.00    Pack years: 2.50    Types: Cigarettes    Quit date: 10/02/2018   Years since quitting: 0.5  . Smokeless tobacco: Never Used  . Tobacco comment: Stopped 2 months ago   Substance and Sexual Activity  . Alcohol use: Yes    Comment: 1-2 beers per week.  . Drug use: No  . Sexual activity: Never  Lifestyle  . Physical activity    Days per week: Not on file    Minutes per session: Not on file  . Stress: Not on file  Relationships  . Social Herbalist on phone: Not on file    Gets together: Not on file    Attends religious service: Not on file    Active member of club or organization: Not on file    Attends meetings of clubs or organizations: Not on file    Relationship status: Not on file  . Intimate partner violence    Fear of current or ex partner: Not on file    Emotionally abused: Not on file    Physically abused: Not on file    Forced sexual activity: Not on file  Other Topics Concern  . Not on file  Social History Narrative   Widowed. Lives alone with 2 cats.   Family History  Problem Relation Age of Onset  . Diabetes Mother   .  Heart disease Mother   . Hypertension Mother   . Heart attack Brother        Died at age 459 of MI  . Emphysema Father     Assessment & Plan:   See Encounters Tab for problem based charting.  Patient discussed with Dr. Sandre Kittyaines

## 2019-04-21 NOTE — Assessment & Plan Note (Addendum)
Ms. Meisinger is due for mammogram and colonoscopy, discussed these with patient.  Will place referral for mammogram and colonoscopy today.  Prescription for Tdap, advised patient to go to pharmacy to obtain this.

## 2019-04-21 NOTE — Assessment & Plan Note (Signed)
Patient reports that she is feeling very anxious, she ran out of her Zoloft medications about 2 weeks ago.  She reports that she has been feeling more depressed recently, has had a decreased appetite, difficulty sleeping, and decreased interest in activities.  PHQ 9 was elevated at 18.  She states that she is mostly just staying at home and has not been very active.  Patient has been unable to discuss with the behavioral health specialist due to her number changing.  She reports that when she was taking the Zoloft she was feeling a lot better, she states that she is feeling okay right now but overall reports some satiety and traction.  She denied any SI at this time.  -Refill Zoloft 25 mg daily -Will send a note to behavioral health, number in chart is correct -Return to clinic in 1 month, if still having symptoms can increase Zoloft to 50 mg daily

## 2019-04-21 NOTE — Assessment & Plan Note (Signed)
Patient is currently not on any medications at this time, blood pressure today was 135/80.  Seems to be well controlled at this time.  We will continue to monitor for now.

## 2019-04-29 NOTE — Progress Notes (Signed)
Internal Medicine Clinic Attending  Case discussed with Dr. Krienke at the time of the visit.  We reviewed the resident's history and exam and pertinent patient test results.  I agree with the assessment, diagnosis, and plan of care documented in the resident's note.  Alexander Raines, M.D., Ph.D.  

## 2019-04-30 ENCOUNTER — Other Ambulatory Visit: Payer: Self-pay | Admitting: Internal Medicine

## 2019-04-30 DIAGNOSIS — Z1231 Encounter for screening mammogram for malignant neoplasm of breast: Secondary | ICD-10-CM

## 2019-05-18 ENCOUNTER — Encounter: Payer: Self-pay | Admitting: *Deleted

## 2019-05-18 ENCOUNTER — Telehealth: Payer: Self-pay | Admitting: *Deleted

## 2019-05-18 NOTE — Telephone Encounter (Signed)
LEFT VOICE MESSAGE FOR PATIENT TO RETURN CALL TO CLINIC. LETTER MAILED TO PATIENT TO CONTACT [facility].

## 2019-06-15 ENCOUNTER — Ambulatory Visit: Payer: Medicare Other

## 2019-09-28 ENCOUNTER — Telehealth: Payer: Self-pay | Admitting: Internal Medicine

## 2019-09-28 NOTE — Telephone Encounter (Signed)
I talked to Olivia Mackie, NP for [facility], doing a wellness check. Stated pt's BP is 158/105 (but stable); pt c/o rapid HR but is 19 today' c/o anxiety and pt told Olivia Mackie she had taken someone else NTG which she told her not to do anymore. As noted, pt has an appt tomorrow.

## 2019-09-28 NOTE — Telephone Encounter (Signed)
House call from [facility] ([place]) reporting patient has a BP of 158/105. Pt is also complaining of rapid heartbeat and anxiety problems.

## 2019-09-29 ENCOUNTER — Encounter: Payer: Self-pay | Admitting: Internal Medicine

## 2019-09-29 ENCOUNTER — Ambulatory Visit (HOSPITAL_COMMUNITY)
Admission: RE | Admit: 2019-09-29 | Discharge: 2019-09-29 | Disposition: A | Discharge status: Home / Self Care | Payer: Medicare Other | Source: Ambulatory Visit | Attending: Internal Medicine | Admitting: Internal Medicine

## 2019-09-29 ENCOUNTER — Ambulatory Visit (INDEPENDENT_AMBULATORY_CARE_PROVIDER_SITE_OTHER): Payer: Medicare Other | Admitting: Internal Medicine

## 2019-09-29 ENCOUNTER — Other Ambulatory Visit: Payer: Self-pay

## 2019-09-29 VITALS — BP 134/88 | HR 99 | Temp 98.4°F | Ht 68.0 in | Wt 133.6 lb

## 2019-09-29 DIAGNOSIS — Z23 Encounter for immunization: Secondary | ICD-10-CM | POA: Diagnosis not present

## 2019-09-29 DIAGNOSIS — Z79899 Other long term (current) drug therapy: Secondary | ICD-10-CM

## 2019-09-29 DIAGNOSIS — F332 Major depressive disorder, recurrent severe without psychotic features: Secondary | ICD-10-CM

## 2019-09-29 DIAGNOSIS — R079 Chest pain, unspecified: Secondary | ICD-10-CM | POA: Diagnosis not present

## 2019-09-29 DIAGNOSIS — I10 Essential (primary) hypertension: Secondary | ICD-10-CM | POA: Diagnosis not present

## 2019-09-29 DIAGNOSIS — F419 Anxiety disorder, unspecified: Secondary | ICD-10-CM | POA: Diagnosis not present

## 2019-09-29 DIAGNOSIS — Z87891 Personal history of nicotine dependence: Secondary | ICD-10-CM

## 2019-09-29 DIAGNOSIS — Z0000 Encounter for general adult medical examination without abnormal findings: Secondary | ICD-10-CM

## 2019-09-29 MED ORDER — SERTRALINE HCL 50 MG PO TABS: 50.0000 mg | ORAL_TABLET | Freq: Every day | ORAL | 1 refills | Status: DC

## 2019-09-29 MED ORDER — LORAZEPAM 0.5 MG PO TABS: 0.5000 mg | ORAL_TABLET | Freq: Three times a day (TID) | ORAL | 0 refills | Status: DC | PRN

## 2019-09-29 NOTE — Progress Notes (Signed)
CC: Hypertension, anxiety  HPI:  [name] is a 63 y.o.  with a PMH listed below presenting for hypertension and anxiety.    Please see A&P for status of the patient's chronic medical conditions  Past Medical History:  Diagnosis Date  . Depression   . Hyperlipidemia   . Hypertension   . Seasonal allergies   . Tobacco use    Review of Systems: Refer to history of present illness and assessment and plans for pertinent review of systems, all others reviewed and negative.  Physical Exam:  Vitals:   09/29/19 1327  BP: 134/88  Pulse: 99  Temp: 98.4 F (36.9 C)  TempSrc: Oral  SpO2: 98%  Weight: 133 lb 9.6 oz (60.6 kg)  Height: 5\' 8"  (1.727 m)   Physical Exam  Constitutional: She is oriented to person, place, and time and well-developed, well-nourished, and in no distress.  HENT:  Head: Normocephalic and atraumatic.  Neck: Normal range of motion. Neck supple. No thyromegaly present.  Cardiovascular: Normal rate, regular rhythm and normal heart sounds.  Pulmonary/Chest: Effort normal and breath sounds normal. No respiratory distress.  Abdominal: Soft. Bowel sounds are normal. She exhibits no distension.  Musculoskeletal: Normal range of motion.        General: No edema.  Neurological: She is alert and oriented to person, place, and time.  Skin: Skin is warm and dry.  Psychiatric: Mood and affect normal.    Social History   Socioeconomic History  . Marital status: Widowed    Spouse name: Not on file  . Number of children: 0  . Years of education: Not on file  . Highest education level: Not on file  Occupational History  . Occupation: Works in a Control and instrumentation engineer Needs  . Financial resource strain: Not on file  . Food insecurity    Worry: Not on file    Inability: Not on file  . Transportation needs    Medical: Not on file    Non-medical: Not on file  Tobacco Use  . Smoking status: Former Smoker    Packs/day: 0.50    Years: 5.00    Pack  years: 2.50    Types: Cigarettes    Quit date: 10/02/2018    Years since quitting: 0.9  . Smokeless tobacco: Never Used  . Tobacco comment: Stopped 2 months ago   Substance and Sexual Activity  . Alcohol use: Yes    Comment: 1-2 beers per week.  . Drug use: No  . Sexual activity: Never  Lifestyle  . Physical activity    Days per week: Not on file    Minutes per session: Not on file  . Stress: Not on file  Relationships  . Social Herbalist on phone: Not on file    Gets together: Not on file    Attends religious service: Not on file    Active member of club or organization: Not on file    Attends meetings of clubs or organizations: Not on file    Relationship status: Not on file  . Intimate partner violence    Fear of current or ex partner: Not on file    Emotionally abused: Not on file    Physically abused: Not on file    Forced sexual activity: Not on file  Other Topics Concern  . Not on file  Social History Narrative   Widowed. Lives alone with 2 cats.   Family History  Problem Relation Age of  Onset  . Diabetes Mother   . Heart disease Mother   . Hypertension Mother   . Heart attack Brother        Died at age 4 of MI  . Emphysema Father     Assessment & Plan:   See Encounters Tab for problem based charting.  Patient discussed with Dr. Heide Spark

## 2019-09-29 NOTE — Assessment & Plan Note (Signed)
Patient reports that she has been having episodes of chest pain within the last 2 months, last episode was about 3 weeks ago.  She states that it will come on out of the blue, not related to exertion or improved with rest. She starts having palpitations and some chest discomfort in the middle of her chest, no radiation, not associated with nausea, vomiting, or shortness of breath.  It would last up to 20 to 30 minutes at a time.  She has taken a friend's nitroglycerin to see if that would help, she reports that it did help but it did take up to 20 to 30 minutes for it to resolve.  She denies any smoking history, drinks about a six pack of beer per week. She has a family history of cardiac issue's, mother had a heart attack.  Seems like a low likelihood this was related to a cardiac issue, will repeat EKG today.  EKG showed normal sinus rhythm, heart rate in the 80s, no axis deviation, mild ST changes in lead V2, no obvious evidence of ischemia.  Will obtain stress test to further evaluate.  If stress test is negative then this may be related to her anxiety.  -Obtain stress test

## 2019-09-29 NOTE — Assessment & Plan Note (Signed)
Patient received flu shot today. Offered pap smear but patient declined.

## 2019-09-29 NOTE — Assessment & Plan Note (Signed)
Patient is currently not on any medications for blood pressure, blood pressure on last visit was 135/80.  Blood pressure was up to 158/105 yesterday per the [facility] NP.  Today her blood pressure is 134/88. BMP in 03/2018 was unremarkable, creatinine was 0.68 at that time.  Patient reports that she does become hypertensive when she gets her anxiety.  We will hold off on starting any medications at this time.

## 2019-09-29 NOTE — Assessment & Plan Note (Signed)
Patient is currently on Zoloft 25 mg daily and Ativan 0.5 mg as needed.  She reports significant depression, states that she has been very tearful, has decreased appetite, decreased energy, difficulty sleeping, and difficulty enjoying things.  She is not doing too much at this time, does talk with her neighbor which she states she does enjoy.  Denies any SI or HI at this time. PHQ-9 is 19. She was wondering if we could increase her Zoloft, she also ran out of the Ativan and is requesting a refill.  Discussed adjusting her medications, will increase her Zoloft to 50 mg daily, make referral to [facility], and refill short course of Ativan as needed.  Discussed that Ativan is only as needed and only for significant anxiety.  She expressed understanding.  -Increase Zoloft to 50 mg daily -Refill short course of Ativan 0.5 mg as needed -Referral for [facility]

## 2019-09-29 NOTE — Patient Instructions (Addendum)
[name],  It was a pleasure to see you today. Thank you for coming in.   Today we discussed your anxiety and depression. In regards to this please increase your Zoloft to 50 mg daily. I have sent in a short prescription for Ativan, please only take this when you have significant anxiety. I have sent in a referral for the behavioral health specialist to further discuss this.   We also discussed your chest pain. This may be related to your anxiety however we will obtain some testing to evaluate your heart to rule this out. You will need to do a stress test, we will contact you to set this up.   Please return to clinic in 3 months or sooner if needed.   Thank you again for coming in.   Asencion Noble.D.

## 2019-09-30 NOTE — Progress Notes (Signed)
Internal Medicine Clinic Attending  Case discussed with Dr. Krienke at the time of the visit.  We reviewed the resident's history and exam and pertinent patient test results.  I agree with the assessment, diagnosis, and plan of care documented in the resident's note.    

## 2019-10-07 ENCOUNTER — Telehealth: Payer: Self-pay | Admitting: Licensed Clinical Social Worker

## 2019-10-07 NOTE — Telephone Encounter (Signed)
Patient was called to discuss a referral for services. Patient agreed, and she will be added at 12:00 on 1/6.

## 2019-10-28 ENCOUNTER — Ambulatory Visit: Payer: Medicare Other | Admitting: Licensed Clinical Social Worker

## 2019-11-26 NOTE — Addendum Note (Signed)
Addended by: Neomia Dear on: 11/26/2019 05:56 PM   Modules accepted: Orders

## 2020-04-10 NOTE — Progress Notes (Signed)
    CC: Anxiety and depression  HPI:  Ms.Kinga Russon is a 64 y.o. with the history listed below presenting for her anxiety, hypertension, and her healthcare maintenance. She reports that she has been out of her medications for the past month.  Past Medical History:  Diagnosis Date  . Depression   . Hyperlipidemia   . Hypertension   . Seasonal allergies   . Tobacco use    Review of Systems:   Constitutional: Negative for chills and fever.  Respiratory: Negative for shortness of breath.   Cardiovascular: Negative for chest pain and leg swelling.  Gastrointestinal: Negative for abdominal pain, nausea and vomiting.  Neurological: Negative for dizziness and headaches.  Psychiatry: Positive for anxiety and depression.  Physical Exam:  Vitals:   04/11/20 1426  BP: (!) 144/95  Pulse: 82  Temp: 98.2 F (36.8 C)  TempSrc: Oral  SpO2: 97%  Weight: 139 lb 11.2 oz (63.4 kg)  Height: 5\' 8"  (1.727 m)   Physical Exam Constitutional:      Appearance: Normal appearance.  HENT:     Head: Normocephalic and atraumatic.     Mouth/Throat:     Mouth: Mucous membranes are moist.     Pharynx: Oropharynx is clear.  Cardiovascular:     Rate and Rhythm: Normal rate and regular rhythm.     Pulses: Normal pulses.     Heart sounds: Normal heart sounds.  Pulmonary:     Effort: Pulmonary effort is normal.     Breath sounds: Normal breath sounds.  Abdominal:     General: Abdomen is flat. Bowel sounds are normal.     Palpations: Abdomen is soft.  Musculoskeletal:        General: No swelling. Normal range of motion.     Cervical back: Normal range of motion and neck supple.  Skin:    General: Skin is warm and dry.     Capillary Refill: Capillary refill takes less than 2 seconds.  Neurological:     General: No focal deficit present.     Mental Status: She is alert and oriented to person, place, and time.  Psychiatric:        Behavior: Behavior normal.     Comments: Intermittent  tearfulness     Assessment & Plan:   See Encounters Tab for problem based charting.  Patient discussed with Dr. 

## 2020-04-11 ENCOUNTER — Other Ambulatory Visit: Payer: Self-pay

## 2020-04-11 ENCOUNTER — Ambulatory Visit (INDEPENDENT_AMBULATORY_CARE_PROVIDER_SITE_OTHER): Payer: Medicare Other | Admitting: Internal Medicine

## 2020-04-11 ENCOUNTER — Encounter: Payer: Self-pay | Admitting: Internal Medicine

## 2020-04-11 VITALS — BP 135/95 | HR 81 | Temp 98.2°F | Ht 68.0 in | Wt 139.7 lb

## 2020-04-11 DIAGNOSIS — I10 Essential (primary) hypertension: Secondary | ICD-10-CM

## 2020-04-11 DIAGNOSIS — Z1211 Encounter for screening for malignant neoplasm of colon: Secondary | ICD-10-CM | POA: Diagnosis not present

## 2020-04-11 DIAGNOSIS — Z0000 Encounter for general adult medical examination without abnormal findings: Secondary | ICD-10-CM

## 2020-04-11 DIAGNOSIS — F332 Major depressive disorder, recurrent severe without psychotic features: Secondary | ICD-10-CM

## 2020-04-11 DIAGNOSIS — F419 Anxiety disorder, unspecified: Secondary | ICD-10-CM

## 2020-04-11 MED ORDER — SERTRALINE HCL 50 MG PO TABS: 50.0000 mg | ORAL_TABLET | Freq: Every day | ORAL | 1 refills | Status: AC

## 2020-04-11 NOTE — Assessment & Plan Note (Signed)
Patient has been feeling very depressed lately, she reports feeling down and has been having trouble sleeping and decreased energy.  She denies any SI or HI.  Her PHQ-9 today is elevated at 20.  She reported that the Zoloft was helping and she was taking it however she ran out about 1 month ago.  On chart review it does appear that she had at appointment with Ms. Willa Rough however must have missed that.  We discussed that we can resume soft, also discussed that there are a number of different antidepressants that we can try if the Zoloft does not help.  She expressed understanding.  -Restart Zoloft, start at 25 mg daily for 1 week then increase to 50 mg daily -Refer her to integrative behavioral health. -Follow-up in 1 month to see efficacy

## 2020-04-11 NOTE — Assessment & Plan Note (Signed)
Discussed Pap smear, patient did not want to do this today.

## 2020-04-11 NOTE — Assessment & Plan Note (Signed)
Patient is not on any blood pressure medications, she is always had mildly elevated blood pressure on initial read with improvement on repeat.  Blood pressure today is elevated at 144/95, repeat was improved at 135/95.  She denies any headaches, lightheadedness, dizziness, fatigue, weakness, or other symptoms.  We discussed working on diet and exercise to try to improve her BP, however if her blood pressure remains elevated then we can consider starting something.  -Provided information on DASH diet

## 2020-04-11 NOTE — Patient Instructions (Addendum)
[name],  It was a pleasure to see you today. Thank you for coming in.   Today we discussed your blood pressure. This was a little high today but improved when we repeated it. Please work on your diet and exercise regimen. Please try to use the DASH diet to help with your blood pressures. We will hold off on starting any medications at this time.   We also discussed your depression and anxiety. I am sorry that you are feeling bad. Please restart taking the Zoloft medication, take 25 mg daily for 1 week, then increase it to 50 mg daily. I have sent in a referral to our Behavioral health specialist [name].    Please return to clinic in 1 month or sooner if needed.   Thank you again for coming in.   Bridget Reynolds M.D.  [facility] Eating Plan DASH stands for "Dietary Approaches to Stop Hypertension." The DASH eating plan is a healthy eating plan that has been shown to reduce high blood pressure (hypertension). It may also reduce your risk for type 2 diabetes, heart disease, and stroke. The DASH eating plan may also help with weight loss. What are tips for following this plan?  General guidelines  Avoid eating more than 2,300 mg (milligrams) of salt (sodium) a day. If you have hypertension, you may need to reduce your sodium intake to 1,500 mg a day.  Limit alcohol intake to no more than 1 drink a day for nonpregnant women and 2 drinks a day for men. One drink equals 12 oz of beer, 5 oz of wine, or 1 oz of hard liquor.  Work with your health care provider to maintain a healthy body weight or to lose weight. Ask what an ideal weight is for you.  Get at least 30 minutes of exercise that causes your heart to beat faster (aerobic exercise) most days of the week. Activities may include walking, swimming, or biking.  Work with your health care provider or diet and nutrition specialist (dietitian) to adjust your eating plan to your individual calorie needs. Reading food labels   Check  food labels for the amount of sodium per serving. Choose foods with less than 5 percent of the Daily Value of sodium. Generally, foods with less than 300 mg of sodium per serving fit into this eating plan.  To find whole grains, look for the word "whole" as the first word in the ingredient list. Shopping  Buy products labeled as "low-sodium" or "no salt added."  Buy fresh foods. Avoid canned foods and premade or frozen meals. Cooking  Avoid adding salt when cooking. Use salt-free seasonings or herbs instead of table salt or sea salt. Check with your health care provider or pharmacist before using salt substitutes.  Do not fry foods. Cook foods using healthy methods such as baking, boiling, grilling, and broiling instead.  Cook with heart-healthy oils, such as olive, canola, soybean, or sunflower oil. Meal planning  Eat a balanced diet that includes: ? 5 or more servings of fruits and vegetables each day. At each meal, try to fill half of your plate with fruits and vegetables. ? Up to 6-8 servings of whole grains each day. ? Less than 6 oz of lean meat, poultry, or fish each day. A 3-oz serving of meat is about the same size as a deck of cards. One egg equals 1 oz. ? 2 servings of low-fat dairy each day. ? A serving of nuts, seeds, or beans 5 times  each week. ? Heart-healthy fats. Healthy fats called Omega-3 fatty acids are found in foods such as flaxseeds and coldwater fish, like sardines, salmon, and mackerel.  Limit how much you eat of the following: ? Canned or prepackaged foods. ? Food that is high in trans fat, such as fried foods. ? Food that is high in saturated fat, such as fatty meat. ? Sweets, desserts, sugary drinks, and other foods with added sugar. ? Full-fat dairy products.  Do not salt foods before eating.  Try to eat at least 2 vegetarian meals each week.  Eat more home-cooked food and less restaurant, buffet, and fast food.  When eating at a restaurant, ask that  your food be prepared with less salt or no salt, if possible. What foods are recommended? The items listed may not be a complete list. Talk with your dietitian about what dietary choices are best for you. Grains Whole-grain or whole-wheat bread. Whole-grain or whole-wheat pasta. Brown rice. [place]. Bulgur. Whole-grain and low-sodium cereals. Pita bread. Low-fat, low-sodium crackers. Whole-wheat flour tortillas. Vegetables Fresh or frozen vegetables (raw, steamed, roasted, or grilled). Low-sodium or reduced-sodium tomato and vegetable juice. Low-sodium or reduced-sodium tomato sauce and tomato paste. Low-sodium or reduced-sodium canned vegetables. Fruits All fresh, dried, or frozen fruit. Canned fruit in natural juice (without added sugar). Meat and other protein foods Skinless chicken or [place]. Ground chicken or [place]. Pork with fat trimmed off. Fish and seafood. Egg whites. Dried beans, peas, or lentils. Unsalted nuts, nut butters, and seeds. Unsalted canned beans. Lean cuts of beef with fat trimmed off. Low-sodium, lean deli meat. Dairy Low-fat (1%) or fat-free (skim) milk. Fat-free, low-fat, or reduced-fat cheeses. Nonfat, low-sodium ricotta or cottage cheese. Low-fat or nonfat yogurt. Low-fat, low-sodium cheese. Fats and oils Soft margarine without trans fats. Vegetable oil. Low-fat, reduced-fat, or light mayonnaise and salad dressings (reduced-sodium). Canola, safflower, olive, soybean, and sunflower oils. Avocado. Seasoning and other foods Herbs. Spices. Seasoning mixes without salt. Unsalted popcorn and pretzels. Fat-free sweets. What foods are not recommended? The items listed may not be a complete list. Talk with your dietitian about what dietary choices are best for you. Grains Baked goods made with fat, such as croissants, muffins, or some breads. Dry pasta or rice meal packs. Vegetables Creamed or fried vegetables. Vegetables in a cheese sauce. Regular canned vegetables  (not low-sodium or reduced-sodium). Regular canned tomato sauce and paste (not low-sodium or reduced-sodium). Regular tomato and vegetable juice (not low-sodium or reduced-sodium). Angie Fava. Olives. Fruits Canned fruit in a light or heavy syrup. Fried fruit. Fruit in cream or butter sauce. Meat and other protein foods Fatty cuts of meat. Ribs. Fried meat. Berniece Salines. Sausage. Bologna and other processed lunch meats. Salami. Fatback. Hotdogs. Bratwurst. Salted nuts and seeds. Canned beans with added salt. Canned or smoked fish. Whole eggs or egg yolks. Chicken or [place] with skin. Dairy Whole or 2% milk, cream, and half-and-half. Whole or full-fat cream cheese. Whole-fat or sweetened yogurt. Full-fat cheese. Nondairy creamers. Whipped toppings. Processed cheese and cheese spreads. Fats and oils Butter. Stick margarine. Lard. Shortening. Ghee. Bacon fat. Tropical oils, such as coconut, palm kernel, or palm oil. Seasoning and other foods Salted popcorn and pretzels. Onion salt, garlic salt, seasoned salt, table salt, and sea salt. Worcestershire sauce. Tartar sauce. Barbecue sauce. Teriyaki sauce. Soy sauce, including reduced-sodium. Steak sauce. Canned and packaged gravies. Fish sauce. Oyster sauce. Cocktail sauce. Horseradish that you find on the shelf. Ketchup. Mustard. Meat flavorings and tenderizers. Bouillon cubes. Hot sauce  and Tabasco sauce. Premade or packaged marinades. Premade or packaged taco seasonings. Relishes. Regular salad dressings. Where to find more information:  National Heart, Lung, and Blood Institute: PopSteam.is  American Heart Association: www.heart.org Summary  The DASH eating plan is a healthy eating plan that has been shown to reduce high blood pressure (hypertension). It may also reduce your risk for type 2 diabetes, heart disease, and stroke.  With the DASH eating plan, you should limit salt (sodium) intake to 2,300 mg a day. If you have hypertension, you may need to  reduce your sodium intake to 1,500 mg a day.  When on the DASH eating plan, aim to eat more fresh fruits and vegetables, whole grains, lean proteins, low-fat dairy, and heart-healthy fats.  Work with your health care provider or diet and nutrition specialist (dietitian) to adjust your eating plan to your individual calorie needs. This information is not intended to replace advice given to you by your health care provider. Make sure you discuss any questions you have with your health care provider. Document Revised: 09/20/2017 Document Reviewed: 10/01/2016 Elsevier Patient Education  2020 [employer].

## 2020-04-11 NOTE — Assessment & Plan Note (Signed)
Ordered fit test
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
## 2020-04-12 NOTE — Progress Notes (Signed)
Internal Medicine Clinic Attending  Case discussed with Dr. Krienke at the time of the visit.  We reviewed the resident's history and exam and pertinent patient test results.  I agree with the assessment, diagnosis, and plan of care documented in the resident's note.    

## 2020-04-26 ENCOUNTER — Telehealth: Payer: Self-pay | Admitting: Licensed Clinical Social Worker

## 2020-04-26 NOTE — Telephone Encounter (Signed)
Patient was called to schedule an appointment. Patient will be added to my schedule for 7/20 @ 10:00 via phone session.

## 2020-05-10 ENCOUNTER — Ambulatory Visit: Payer: Medicare Other | Admitting: Licensed Clinical Social Worker

## 2020-05-10 ENCOUNTER — Telehealth: Payer: Self-pay | Admitting: Licensed Clinical Social Worker

## 2020-05-10 NOTE — Telephone Encounter (Signed)
Patient was called for her scheduled appointment. Patient did not answer, and a vm was left for the patient to call the office to get back on my schedule due to missing her appointment.

## 2020-05-11 NOTE — Addendum Note (Signed)
Addended by: Neomia Dear on: 05/11/2020 07:10 PM   Modules accepted: Orders

## 2020-07-12 DIAGNOSIS — D649 Anemia, unspecified: Secondary | ICD-10-CM | POA: Diagnosis not present

## 2020-07-12 DIAGNOSIS — Z79899 Other long term (current) drug therapy: Secondary | ICD-10-CM | POA: Diagnosis not present

## 2020-07-12 DIAGNOSIS — M5441 Lumbago with sciatica, right side: Secondary | ICD-10-CM | POA: Diagnosis not present

## 2020-07-12 DIAGNOSIS — M5442 Lumbago with sciatica, left side: Secondary | ICD-10-CM | POA: Diagnosis not present

## 2020-07-12 DIAGNOSIS — G8929 Other chronic pain: Secondary | ICD-10-CM | POA: Diagnosis not present

## 2020-07-12 DIAGNOSIS — E559 Vitamin D deficiency, unspecified: Secondary | ICD-10-CM | POA: Diagnosis not present

## 2020-08-17 ENCOUNTER — Other Ambulatory Visit: Payer: Self-pay | Admitting: Registered Nurse

## 2020-08-17 DIAGNOSIS — Z1231 Encounter for screening mammogram for malignant neoplasm of breast: Secondary | ICD-10-CM

## 2021-03-18 ENCOUNTER — Encounter: Payer: Self-pay | Admitting: *Deleted

## 2021-05-24 ENCOUNTER — Telehealth: Payer: Self-pay

## 2021-05-24 NOTE — Telephone Encounter (Signed)
Tried to contact patient but numbers listed were incorrect.   RE: scheduling for mobile mammo event in August or September.

## 2021-08-09 ENCOUNTER — Encounter: Payer: Self-pay | Admitting: *Deleted

## 2021-08-09 NOTE — Progress Notes (Unsigned)

## 2021-08-25 ENCOUNTER — Encounter: Payer: Self-pay | Admitting: Student

## 2021-08-25 NOTE — Progress Notes (Signed)
Things That May Be Affecting Your Health:  Alcohol  Hearing loss X Pain   X Depression  Home Safety  Sexual Health   Diabetes  Lack of physical activity  Stress   Difficulty with daily activities  Loneliness  Tiredness   Drug use  Medicines  Tobacco use   Falls  Motor Vehicle Safety  Weight   Food choices  Oral Health  Other    YOUR PERSONALIZED HEALTH PLAN : 1. Schedule your next subsequent Medicare Wellness visit in one year 2. Attend all of your regular appointments to address your medical issues 3. Complete the preventative screenings and services   Annual Wellness Visit   Medicare Covered Preventative Screenings and Services  Services & Screenings Men and Women Who How Often Need? Date of Last Service Action  Abdominal Aortic Aneurysm Adults with AAA risk factors Once      Alcohol Misuse and Counseling All Adults Screening once a year if no alcohol misuse. Counseling up to 4 face to face sessions.     Bone Density Measurement  Adults at risk for osteoporosis Once every 2 yrs      Lipid Panel Z13.6 All adults without CV disease Once every 5 yrs       Colorectal Cancer  Stool sample or Colonoscopy All adults 50 and older  Once every year Every 10 years X       Depression All Adults Once a year X Today   Diabetes Screening Blood glucose, post glucose load, or GTT Z13.1 All adults at risk Pre-diabetics Once per year Twice per year X     Diabetes  Self-Management Training All adults Diabetics 10 hrs first year; 2 hours subsequent years. Requires Copay     Glaucoma Diabetics Family history of glaucoma African Americans 50 yrs + Hispanic Americans 65 yrs + Annually - requires coppay      Hepatitis C Z72.89 or F19.20 High Risk for HCV Born between 1945 and 1965 Annually Once      HIV Z11.4 All adults based on risk Annually btw ages 26 & 64 regardless of risk Annually > 65 yrs if at increased risk      Lung Cancer Screening Asymptomatic adults aged 20-77 with 30  pack yr history and current smoker OR quit within the last 15 yrs Annually Must have counseling and shared decision making documentation before first screen      Medical Nutrition Therapy Adults with  Diabetes Renal disease Kidney transplant within past 3 yrs 3 hours first year; 2 hours subsequent years     Obesity and Counseling All adults Screening once a year Counseling if BMI 30 or higher  Today   Tobacco Use Counseling Adults who use tobacco  Up to 8 visits in one year     Vaccines Z23 Hepatitis B Influenza  Pneumonia  Adults  Once Once every flu season Two different vaccines separated by one year X    Next Annual Wellness Visit People with Medicare Every year  Today     Services & Screenings Women Who How Often Need  Date of Last Service Action  Mammogram  Z12.31 Women over 40 One baseline ages 49-39. Annually ager 40 yrs+ X     Pap tests All women Annually if high risk. Every 2 yrs for normal risk women      Screening for cervical cancer with  Pap (Z01.419 nl or Z01.411abnl) & HPV Z11.51 Women aged 11 to 14 Once every 5 yrs X  Screening pelvic and breast exams All women Annually if high risk. Every 2 yrs for normal risk women     Sexually Transmitted Diseases Chlamydia Gonorrhea Syphilis All at risk adults Annually for non pregnant females at increased risk         Services & Screenings Men Who How Ofter Need  Date of Last Service Action  Prostate Cancer - DRE & PSA Men over 50 Annually.  DRE might require a copay.        Sexually Transmitted Diseases Syphilis All at risk adults Annually for men at increased risk      Health Maintenance List Health Maintenance  Topic Date Due   TETANUS/TDAP  Never done   PAP SMEAR-Modifier  Never done   COLONOSCOPY (Pts 45-78yrs Insurance coverage will need to be confirmed)  Never done   Zoster Vaccines- Shingrix (1 of 2) Never done   MAMMOGRAM  02/01/2008   COLON CANCER SCREENING ANNUAL FOBT  02/04/2019    INFLUENZA VACCINE  05/22/2021   Hepatitis C Screening  Completed   HIV Screening  Completed   Pneumococcal Vaccine 84-55 Years old  Aged Out   HPV VACCINES  Aged Out
